# Patient Record
Sex: Female | Born: 1935 | Race: White | Hispanic: No | Marital: Married
Health system: Southern US, Community
[De-identification: ages and names within clinical notes are randomized; demographics above are authoritative.]

---

## 2004-09-03 ENCOUNTER — Ambulatory Visit: Payer: Self-pay | Admitting: Family Medicine

## 2004-09-08 ENCOUNTER — Ambulatory Visit: Payer: Self-pay | Admitting: Unknown Physician Specialty

## 2007-03-16 ENCOUNTER — Ambulatory Visit: Payer: Self-pay | Admitting: Emergency Medicine

## 2007-04-18 ENCOUNTER — Ambulatory Visit: Payer: Self-pay | Admitting: Ophthalmology

## 2007-04-18 ENCOUNTER — Other Ambulatory Visit: Payer: Self-pay

## 2007-04-26 ENCOUNTER — Ambulatory Visit: Payer: Self-pay | Admitting: Ophthalmology

## 2007-05-24 ENCOUNTER — Ambulatory Visit: Payer: Self-pay | Admitting: Ophthalmology

## 2007-10-27 ENCOUNTER — Ambulatory Visit: Payer: Self-pay | Admitting: Family Medicine

## 2008-10-02 ENCOUNTER — Ambulatory Visit: Payer: Self-pay | Admitting: Family Medicine

## 2009-02-12 ENCOUNTER — Ambulatory Visit: Payer: Self-pay | Admitting: Unknown Physician Specialty

## 2009-05-14 ENCOUNTER — Ambulatory Visit: Payer: Self-pay | Admitting: Family Medicine

## 2009-09-09 ENCOUNTER — Ambulatory Visit: Payer: Self-pay | Admitting: Family Medicine

## 2010-06-28 ENCOUNTER — Inpatient Hospital Stay: Payer: Self-pay | Admitting: Internal Medicine

## 2010-07-14 ENCOUNTER — Ambulatory Visit: Payer: Self-pay | Admitting: Family Medicine

## 2011-12-16 ENCOUNTER — Ambulatory Visit: Payer: Self-pay | Admitting: Family Medicine

## 2012-03-05 ENCOUNTER — Emergency Department: Payer: Self-pay | Admitting: Emergency Medicine

## 2012-03-05 LAB — CBC
HCT: 33.1 % — ABNORMAL LOW (ref 35.0–47.0)
HGB: 11 g/dL — ABNORMAL LOW (ref 12.0–16.0)
MCV: 89 fL (ref 80–100)
Platelet: 261 10*3/uL (ref 150–440)
RBC: 3.72 10*6/uL — ABNORMAL LOW (ref 3.80–5.20)
WBC: 8.3 10*3/uL (ref 3.6–11.0)

## 2012-03-05 LAB — COMPREHENSIVE METABOLIC PANEL
BUN: 17 mg/dL (ref 7–18)
Bilirubin,Total: 0.3 mg/dL (ref 0.2–1.0)
Calcium, Total: 8.6 mg/dL (ref 8.5–10.1)
Chloride: 98 mmol/L (ref 98–107)
EGFR (Non-African Amer.): >60
Potassium: 3.8 mmol/L (ref 3.5–5.1)
SGOT(AST): 26 U/L (ref 15–37)

## 2012-03-05 LAB — TROPONIN I: Troponin-I: 0.02 ng/mL

## 2012-05-17 ENCOUNTER — Inpatient Hospital Stay: Payer: Self-pay | Admitting: Internal Medicine

## 2012-05-17 LAB — COMPREHENSIVE METABOLIC PANEL
Albumin: 3.2 g/dL — ABNORMAL LOW (ref 3.4–5.0)
Alkaline Phosphatase: 64 U/L (ref 50–136)
Anion Gap: 8 (ref 7–16)
BUN: 16 mg/dL (ref 7–18)
Calcium, Total: 8.6 mg/dL (ref 8.5–10.1)
Chloride: 94 mmol/L — ABNORMAL LOW (ref 98–107)
Co2: 32 mmol/L (ref 21–32)
Creatinine: 0.55 mg/dL — ABNORMAL LOW (ref 0.60–1.30)
EGFR (Non-African Amer.): >60
Osmolality: 271 (ref 275–301)
Potassium: 3.9 mmol/L (ref 3.5–5.1)
SGOT(AST): 47 U/L — ABNORMAL HIGH (ref 15–37)
SGPT (ALT): 84 U/L — ABNORMAL HIGH (ref 12–78)
Sodium: 134 mmol/L — ABNORMAL LOW (ref 136–145)

## 2012-05-17 LAB — IRON AND TIBC
Iron Saturation: 15 %
Iron: 44 ug/dL — ABNORMAL LOW (ref 50–170)
Unbound Iron-Bind.Cap.: 259 ug/dL

## 2012-05-17 LAB — CBC
HCT: 26.9 % — ABNORMAL LOW (ref 35.0–47.0)
HGB: 8.5 g/dL — ABNORMAL LOW (ref 12.0–16.0)
MCH: 29.6 pg (ref 26.0–34.0)
MCHC: 31.8 g/dL — ABNORMAL LOW (ref 32.0–36.0)
MCV: 93 fL (ref 80–100)
Platelet: 381 10*3/uL (ref 150–440)
RBC: 2.89 10*6/uL — ABNORMAL LOW (ref 3.80–5.20)
WBC: 13.6 10*3/uL — ABNORMAL HIGH (ref 3.6–11.0)

## 2012-05-17 LAB — FERRITIN: Ferritin (ARMC): 314 ng/mL (ref 8–388)

## 2012-05-17 LAB — CK TOTAL AND CKMB (NOT AT ARMC)
CK, Total: 66 U/L (ref 21–215)
CK, Total: 69 U/L (ref 21–215)
CK-MB: 1.8 ng/mL (ref 0.5–3.6)
CK-MB: 2.4 ng/mL (ref 0.5–3.6)

## 2012-05-17 LAB — PRO B NATRIURETIC PEPTIDE: B-Type Natriuretic Peptide: 2917 pg/mL — ABNORMAL HIGH (ref 0–450)

## 2012-05-17 LAB — TROPONIN I: Troponin-I: 0.27 ng/mL — ABNORMAL HIGH

## 2012-05-17 LAB — PROTIME-INR: INR: 1.1

## 2012-05-18 LAB — CBC WITH DIFFERENTIAL/PLATELET
Basophil #: 0 10*3/uL (ref 0.0–0.1)
Basophil %: 0.2 %
Eosinophil #: 0 10*3/uL (ref 0.0–0.7)
Eosinophil %: 0 %
HCT: 26.4 % — ABNORMAL LOW (ref 35.0–47.0)
HGB: 9.1 g/dL — ABNORMAL LOW (ref 12.0–16.0)
Lymphocyte %: 4.7 %
MCH: 31.4 pg (ref 26.0–34.0)
MCHC: 34.4 g/dL (ref 32.0–36.0)
MCV: 91 fL (ref 80–100)
Monocyte #: 0.3 x10 3/mm (ref 0.2–0.9)
Neutrophil #: 6 10*3/uL (ref 1.4–6.5)
Neutrophil %: 90.4 %
RBC: 2.89 10*6/uL — ABNORMAL LOW (ref 3.80–5.20)
WBC: 6.6 10*3/uL (ref 3.6–11.0)

## 2012-05-18 LAB — LIPID PANEL
HDL Cholesterol: 36 mg/dL — ABNORMAL LOW (ref 40–60)
Ldl Cholesterol, Calc: 109 mg/dL — ABNORMAL HIGH (ref 0–100)
VLDL Cholesterol, Calc: 18 mg/dL (ref 5–40)

## 2012-05-18 LAB — COMPREHENSIVE METABOLIC PANEL
Albumin: 2.7 g/dL — ABNORMAL LOW (ref 3.4–5.0)
Alkaline Phosphatase: 52 U/L (ref 50–136)
Calcium, Total: 8.2 mg/dL — ABNORMAL LOW (ref 8.5–10.1)
Creatinine: 0.64 mg/dL (ref 0.60–1.30)
Glucose: 110 mg/dL — ABNORMAL HIGH (ref 65–99)
Osmolality: 275 (ref 275–301)
SGOT(AST): 27 U/L (ref 15–37)
Sodium: 137 mmol/L (ref 136–145)

## 2012-05-18 LAB — TROPONIN I: Troponin-I: 0.83 ng/mL — ABNORMAL HIGH

## 2012-05-18 LAB — HEMOGLOBIN A1C: Hemoglobin A1C: 5.8 % (ref 4.2–6.3)

## 2012-05-18 LAB — CK TOTAL AND CKMB (NOT AT ARMC): CK, Total: 60 U/L (ref 21–215)

## 2012-05-18 LAB — PROTEIN, BODY FLUID: Protein, Body Fluid: 1.3 g/dL

## 2012-05-19 LAB — CBC WITH DIFFERENTIAL/PLATELET
Basophil #: 0 10*3/uL (ref 0.0–0.1)
Eosinophil #: 0 10*3/uL (ref 0.0–0.7)
Eosinophil %: 0.1 %
HCT: 27.5 % — ABNORMAL LOW (ref 35.0–47.0)
HGB: 9.1 g/dL — ABNORMAL LOW (ref 12.0–16.0)
Lymphocyte #: 0.8 10*3/uL — ABNORMAL LOW (ref 1.0–3.6)
Lymphocyte %: 7.7 %
MCH: 30.5 pg (ref 26.0–34.0)
MCHC: 33.2 g/dL (ref 32.0–36.0)
MCV: 92 fL (ref 80–100)
Monocyte #: 1.2 x10 3/mm — ABNORMAL HIGH (ref 0.2–0.9)
Neutrophil #: 9 10*3/uL — ABNORMAL HIGH (ref 1.4–6.5)
RDW: 15.1 % — ABNORMAL HIGH (ref 11.5–14.5)
WBC: 11.1 10*3/uL — ABNORMAL HIGH (ref 3.6–11.0)

## 2012-05-19 LAB — BASIC METABOLIC PANEL
Anion Gap: 3 — ABNORMAL LOW (ref 7–16)
BUN: 17 mg/dL (ref 7–18)
Co2: 41 mmol/L (ref 21–32)
Creatinine: 0.89 mg/dL (ref 0.60–1.30)
EGFR (African American): >60
EGFR (Non-African Amer.): >60
Glucose: 99 mg/dL (ref 65–99)
Sodium: 133 mmol/L — ABNORMAL LOW (ref 136–145)

## 2012-05-19 LAB — TROPONIN I: Troponin-I: 0.42 ng/mL — ABNORMAL HIGH

## 2012-05-20 LAB — BASIC METABOLIC PANEL
Anion Gap: 8 (ref 7–16)
BUN: 13 mg/dL (ref 7–18)
Chloride: 88 mmol/L — ABNORMAL LOW (ref 98–107)
Co2: 34 mmol/L — ABNORMAL HIGH (ref 21–32)
Creatinine: 0.77 mg/dL (ref 0.60–1.30)
Creatinine: 0.91 mg/dL (ref 0.60–1.30)
EGFR (African American): >60
EGFR (African American): >60
EGFR (Non-African Amer.): >60
EGFR (Non-African Amer.): >60
Glucose: 87 mg/dL (ref 65–99)
Osmolality: 262 (ref 275–301)
Potassium: 3.3 mmol/L — ABNORMAL LOW (ref 3.5–5.1)
Sodium: 128 mmol/L — ABNORMAL LOW (ref 136–145)

## 2012-05-20 LAB — CBC WITH DIFFERENTIAL/PLATELET
Basophil %: 0.1 %
Eosinophil #: 0.1 10*3/uL (ref 0.0–0.7)
Eosinophil %: 0.7 %
HCT: 27.4 % — ABNORMAL LOW (ref 35.0–47.0)
HGB: 9.1 g/dL — ABNORMAL LOW (ref 12.0–16.0)
Lymphocyte #: 0.7 10*3/uL — ABNORMAL LOW (ref 1.0–3.6)
MCH: 30.3 pg (ref 26.0–34.0)
MCHC: 33.2 g/dL (ref 32.0–36.0)
MCV: 91 fL (ref 80–100)
Monocyte #: 0.7 x10 3/mm (ref 0.2–0.9)
Neutrophil #: 6.3 10*3/uL (ref 1.4–6.5)
RBC: 3 10*6/uL — ABNORMAL LOW (ref 3.80–5.20)

## 2012-05-21 LAB — BASIC METABOLIC PANEL
Anion Gap: 6 — ABNORMAL LOW (ref 7–16)
Calcium, Total: 8.6 mg/dL (ref 8.5–10.1)
Chloride: 95 mmol/L — ABNORMAL LOW (ref 98–107)
EGFR (African American): >60
EGFR (Non-African Amer.): >60
Glucose: 92 mg/dL (ref 65–99)
Osmolality: 267 (ref 275–301)
Potassium: 3.9 mmol/L (ref 3.5–5.1)
Sodium: 134 mmol/L — ABNORMAL LOW (ref 136–145)

## 2012-05-22 ENCOUNTER — Telehealth: Payer: Self-pay

## 2012-05-22 LAB — BASIC METABOLIC PANEL
BUN: 7 mg/dL (ref 7–18)
Calcium, Total: 9 mg/dL (ref 8.5–10.1)
Chloride: 98 mmol/L (ref 98–107)
Co2: 33 mmol/L — ABNORMAL HIGH (ref 21–32)
Creatinine: 0.83 mg/dL (ref 0.60–1.30)
EGFR (African American): >60
EGFR (Non-African Amer.): >60
Osmolality: 270 (ref 275–301)
Potassium: 4.4 mmol/L (ref 3.5–5.1)

## 2012-05-22 LAB — BODY FLUID CULTURE

## 2012-05-22 NOTE — Telephone Encounter (Signed)
Yes, please have Home Health Care contact [facility] at [phone] for orders. Please advise Home Health Care that I no longer work at [facility] and patient has not established care with me in [place]; thus, I cannot provide the orders.

## 2012-05-22 NOTE — Telephone Encounter (Signed)
JULIE FROM [facility] STATES THEY ARE RELEASING PT TODAY AND WOULD LIKE TO KNOW IF DR Katrinka Blazing WOULD BE WILLING TO DO HOME HEALTH CARE ON PT PLEASE CALL [phone] SHE HASN'T BEEN A PT HERE SO I DO NOT HAVE A DATE TO LOOK AT

## 2012-05-22 NOTE — Telephone Encounter (Signed)
Dr Katrinka Blazing, Do we need to advise Home Health Care to contact your former office? Will one of the providers over there write HH orders for pt?

## 2012-05-23 NOTE — Telephone Encounter (Signed)
LMOM w/info to call [facility] for orders and explained situation. Gave her number to call.

## 2012-05-29 ENCOUNTER — Emergency Department: Payer: Self-pay | Admitting: Emergency Medicine

## 2012-05-29 LAB — CBC
HCT: 30.5 % — ABNORMAL LOW (ref 35.0–47.0)
MCV: 89 fL (ref 80–100)
Platelet: 382 10*3/uL (ref 150–440)
RBC: 3.42 10*6/uL — ABNORMAL LOW (ref 3.80–5.20)
RDW: 15.7 % — ABNORMAL HIGH (ref 11.5–14.5)
WBC: 4.9 10*3/uL (ref 3.6–11.0)

## 2012-05-29 LAB — URINALYSIS, COMPLETE
Bacteria: NONE SEEN
Bilirubin,UR: NEGATIVE
Blood: NEGATIVE
Glucose,UR: NEGATIVE mg/dL (ref 0–75)
Ketone: NEGATIVE
Leukocyte Esterase: NEGATIVE
Nitrite: NEGATIVE
Ph: 6 (ref 4.5–8.0)
Protein: NEGATIVE
RBC,UR: 17 /HPF (ref 0–5)
Specific Gravity: 1.006 (ref 1.003–1.030)
Squamous Epithelial: NONE SEEN
WBC UR: 10 /HPF (ref 0–5)

## 2012-05-29 LAB — COMPREHENSIVE METABOLIC PANEL
Albumin: 3.6 g/dL (ref 3.4–5.0)
Anion Gap: 8 (ref 7–16)
Chloride: 99 mmol/L (ref 98–107)
Co2: 27 mmol/L (ref 21–32)
EGFR (African American): >60
Osmolality: 270 (ref 275–301)
Potassium: 4.4 mmol/L (ref 3.5–5.1)
SGOT(AST): 19 U/L (ref 15–37)
SGPT (ALT): 37 U/L (ref 12–78)
Total Protein: 6.9 g/dL (ref 6.4–8.2)

## 2012-05-29 LAB — TROPONIN I: Troponin-I: 0.03 ng/mL

## 2012-06-12 ENCOUNTER — Inpatient Hospital Stay: Payer: Self-pay | Admitting: Internal Medicine

## 2012-06-12 LAB — COMPREHENSIVE METABOLIC PANEL
Albumin: 3.4 g/dL (ref 3.4–5.0)
Alkaline Phosphatase: 86 U/L (ref 50–136)
Anion Gap: 9 (ref 7–16)
BUN: 11 mg/dL (ref 7–18)
Bilirubin,Total: 0.1 mg/dL — ABNORMAL LOW (ref 0.2–1.0)
Co2: 19 mmol/L — ABNORMAL LOW (ref 21–32)
EGFR (African American): >60
Glucose: 87 mg/dL (ref 65–99)
Osmolality: 256 (ref 275–301)
Potassium: 5.3 mmol/L — ABNORMAL HIGH (ref 3.5–5.1)
Sodium: 128 mmol/L — ABNORMAL LOW (ref 136–145)

## 2012-06-12 LAB — URINALYSIS, COMPLETE
Bilirubin,UR: NEGATIVE
Blood: NEGATIVE
Hyaline Cast: 1
Ketone: NEGATIVE
Nitrite: NEGATIVE
Ph: 5 (ref 4.5–8.0)
Protein: NEGATIVE
RBC,UR: <1 /HPF (ref 0–5)
Specific Gravity: 1.005 (ref 1.003–1.030)
Squamous Epithelial: <1

## 2012-06-12 LAB — CBC
HGB: 10.2 g/dL — ABNORMAL LOW (ref 12.0–16.0)
MCH: 30.5 pg (ref 26.0–34.0)
MCHC: 34 g/dL (ref 32.0–36.0)
Platelet: 374 10*3/uL (ref 150–440)
RDW: 16.4 % — ABNORMAL HIGH (ref 11.5–14.5)
WBC: 8.7 10*3/uL (ref 3.6–11.0)

## 2012-06-12 LAB — TROPONIN I: Troponin-I: 0.03 ng/mL

## 2012-06-12 LAB — ACETAMINOPHEN LEVEL: Acetaminophen: 30 ug/mL

## 2012-06-12 LAB — URINE PH: Ph: 7 (ref 4.5–8.0)

## 2012-06-12 LAB — CK TOTAL AND CKMB (NOT AT ARMC): CK-MB: <0.5 ng/mL — ABNORMAL LOW (ref 0.5–3.6)

## 2012-06-12 LAB — SALICYLATE LEVEL: Salicylates, Serum: 44.5 mg/dL

## 2012-06-13 LAB — BASIC METABOLIC PANEL
Calcium, Total: 7.5 mg/dL — ABNORMAL LOW (ref 8.5–10.1)
Co2: 32 mmol/L (ref 21–32)
EGFR (African American): >60
EGFR (Non-African Amer.): 53 — ABNORMAL LOW
Glucose: 96 mg/dL (ref 65–99)
Osmolality: 273 (ref 275–301)

## 2012-06-13 LAB — HEPATIC FUNCTION PANEL A (ARMC)
Albumin: 2.8 g/dL — ABNORMAL LOW (ref 3.4–5.0)
Alkaline Phosphatase: 72 U/L (ref 50–136)
Bilirubin, Direct: <0.1 mg/dL (ref 0.00–0.20)
Total Protein: 5.5 g/dL — ABNORMAL LOW (ref 6.4–8.2)

## 2012-06-13 LAB — CBC WITH DIFFERENTIAL/PLATELET
Basophil %: 1.2 %
Eosinophil %: 0.8 %
Lymphocyte #: 2 10*3/uL (ref 1.0–3.6)
Lymphocyte %: 33.7 %
MCH: 30.6 pg (ref 26.0–34.0)
MCHC: 34.4 g/dL (ref 32.0–36.0)
MCV: 89 fL (ref 80–100)
Monocyte %: 8.2 %
Platelet: 326 10*3/uL (ref 150–440)
RDW: 16.2 % — ABNORMAL HIGH (ref 11.5–14.5)
WBC: 6.1 10*3/uL (ref 3.6–11.0)

## 2012-06-13 LAB — SALICYLATE LEVEL: Salicylates, Serum: 31.7 mg/dL

## 2012-06-13 LAB — ACETAMINOPHEN LEVEL: Acetaminophen: 8 ug/mL — ABNORMAL LOW

## 2012-07-27 ENCOUNTER — Inpatient Hospital Stay: Payer: Self-pay | Admitting: Internal Medicine

## 2012-07-27 LAB — COMPREHENSIVE METABOLIC PANEL
Albumin: 3.1 g/dL — ABNORMAL LOW (ref 3.4–5.0)
Anion Gap: 11 (ref 7–16)
BUN: 12 mg/dL (ref 7–18)
Bilirubin,Total: 0.2 mg/dL (ref 0.2–1.0)
Creatinine: 0.8 mg/dL (ref 0.60–1.30)
EGFR (African American): >60
EGFR (Non-African Amer.): >60
Glucose: 89 mg/dL (ref 65–99)
Osmolality: 240 (ref 275–301)
SGOT(AST): 20 U/L (ref 15–37)
Sodium: 119 mmol/L — CL (ref 136–145)

## 2012-07-27 LAB — DRUG SCREEN, URINE
Amphetamines, Ur Screen: NEGATIVE (ref ?–1000)
Benzodiazepine, Ur Scrn: NEGATIVE (ref ?–200)
Cannabinoid 50 Ng, Ur ~~LOC~~: NEGATIVE (ref ?–50)
MDMA (Ecstasy)Ur Screen: NEGATIVE (ref ?–500)
Methadone, Ur Screen: NEGATIVE (ref ?–300)
Phencyclidine (PCP) Ur S: NEGATIVE (ref ?–25)

## 2012-07-27 LAB — CBC
HCT: 31.1 % — ABNORMAL LOW (ref 35.0–47.0)
HGB: 10.9 g/dL — ABNORMAL LOW (ref 12.0–16.0)
MCHC: 35 g/dL (ref 32.0–36.0)
RBC: 3.63 10*6/uL — ABNORMAL LOW (ref 3.80–5.20)
RDW: 15.4 % — ABNORMAL HIGH (ref 11.5–14.5)
WBC: 26.2 10*3/uL — ABNORMAL HIGH (ref 3.6–11.0)

## 2012-07-27 LAB — SALICYLATE LEVEL: Salicylates, Serum: 20.4 mg/dL — ABNORMAL HIGH

## 2012-07-27 LAB — URINALYSIS, COMPLETE
Bacteria: NONE SEEN
Bilirubin,UR: NEGATIVE
Blood: NEGATIVE
Glucose,UR: NEGATIVE mg/dL (ref 0–75)
Nitrite: NEGATIVE
Protein: NEGATIVE
RBC,UR: NONE SEEN /HPF (ref 0–5)
Squamous Epithelial: NONE SEEN
WBC UR: NONE SEEN /HPF (ref 0–5)

## 2012-07-27 LAB — TROPONIN I: Troponin-I: <0.02 ng/mL

## 2012-07-27 LAB — ACETAMINOPHEN LEVEL: Acetaminophen: 6 ug/mL — ABNORMAL LOW

## 2012-07-27 LAB — AMMONIA: Ammonia, Plasma: <25 mcmol/L (ref 11–32)

## 2012-07-28 LAB — BASIC METABOLIC PANEL
BUN: 11 mg/dL (ref 7–18)
Chloride: 91 mmol/L — ABNORMAL LOW (ref 98–107)
Osmolality: 246 (ref 275–301)
Potassium: 4.2 mmol/L (ref 3.5–5.1)

## 2012-07-28 LAB — CBC WITH DIFFERENTIAL/PLATELET
Basophil #: 0 10*3/uL (ref 0.0–0.1)
Basophil %: 0.3 %
Eosinophil #: 0.1 10*3/uL (ref 0.0–0.7)
Eosinophil %: 0.4 %
HGB: 9.6 g/dL — ABNORMAL LOW (ref 12.0–16.0)
Lymphocyte %: 6.8 %
MCV: 87 fL (ref 80–100)
Monocyte %: 5.3 %
Neutrophil %: 87.2 %
Platelet: 285 10*3/uL (ref 150–440)
RBC: 3.28 10*6/uL — ABNORMAL LOW (ref 3.80–5.20)
RDW: 15.2 % — ABNORMAL HIGH (ref 11.5–14.5)
WBC: 16.2 10*3/uL — ABNORMAL HIGH (ref 3.6–11.0)

## 2012-07-28 LAB — URIC ACID: Uric Acid: 3.7 mg/dL (ref 2.6–6.0)

## 2012-07-28 LAB — SODIUM: Sodium: 123 mmol/L — ABNORMAL LOW (ref 136–145)

## 2012-07-29 LAB — CBC WITH DIFFERENTIAL/PLATELET
Basophil #: 0.1 10*3/uL (ref 0.0–0.1)
Eosinophil %: 1.3 %
HCT: 27.1 % — ABNORMAL LOW (ref 35.0–47.0)
HGB: 9.6 g/dL — ABNORMAL LOW (ref 12.0–16.0)
Lymphocyte #: 1.2 10*3/uL (ref 1.0–3.6)
MCH: 30.9 pg (ref 26.0–34.0)
MCHC: 35.6 g/dL (ref 32.0–36.0)
Monocyte #: 0.6 x10 3/mm (ref 0.2–0.9)
Monocyte %: 7.7 %
Neutrophil #: 5.6 10*3/uL (ref 1.4–6.5)
Neutrophil %: 74.8 %
RBC: 3.13 10*6/uL — ABNORMAL LOW (ref 3.80–5.20)

## 2012-07-29 LAB — URINE CULTURE

## 2012-07-30 LAB — SODIUM
Sodium: 135 mmol/L — ABNORMAL LOW (ref 136–145)
Sodium: 135 mmol/L — ABNORMAL LOW (ref 136–145)

## 2012-07-31 LAB — PROTEIN ELECTROPHORESIS(ARMC)

## 2012-08-02 LAB — CULTURE, BLOOD (SINGLE)

## 2012-10-14 ENCOUNTER — Emergency Department: Payer: Self-pay | Admitting: Emergency Medicine

## 2012-10-14 LAB — BASIC METABOLIC PANEL
Anion Gap: 7 (ref 7–16)
Chloride: 100 mmol/L (ref 98–107)
Co2: 26 mmol/L (ref 21–32)
Creatinine: 0.84 mg/dL (ref 0.60–1.30)
EGFR (African American): >60
Osmolality: 266 (ref 275–301)
Sodium: 133 mmol/L — ABNORMAL LOW (ref 136–145)

## 2012-10-14 LAB — CBC
HCT: 31.3 % — ABNORMAL LOW (ref 35.0–47.0)
HGB: 10.3 g/dL — ABNORMAL LOW (ref 12.0–16.0)
MCH: 27.9 pg (ref 26.0–34.0)
MCHC: 33.1 g/dL (ref 32.0–36.0)
MCV: 84 fL (ref 80–100)
Platelet: 382 10*3/uL (ref 150–440)
RBC: 3.71 10*6/uL — ABNORMAL LOW (ref 3.80–5.20)
RDW: 15.3 % — ABNORMAL HIGH (ref 11.5–14.5)

## 2012-10-14 LAB — TROPONIN I: Troponin-I: <0.02 ng/mL

## 2012-12-24 ENCOUNTER — Inpatient Hospital Stay: Payer: Self-pay | Admitting: Internal Medicine

## 2012-12-24 LAB — COMPREHENSIVE METABOLIC PANEL
BUN: 20 mg/dL — ABNORMAL HIGH (ref 7–18)
Bilirubin,Total: 0.1 mg/dL — ABNORMAL LOW (ref 0.2–1.0)
Calcium, Total: 8.2 mg/dL — ABNORMAL LOW (ref 8.5–10.1)
Co2: 26 mmol/L (ref 21–32)
Creatinine: 1.25 mg/dL (ref 0.60–1.30)
EGFR (African American): 49 — ABNORMAL LOW
Glucose: 187 mg/dL — ABNORMAL HIGH (ref 65–99)
Osmolality: 274 (ref 275–301)
SGOT(AST): 43 U/L — ABNORMAL HIGH (ref 15–37)
SGPT (ALT): 81 U/L — ABNORMAL HIGH (ref 12–78)

## 2012-12-24 LAB — CBC
HGB: 11 g/dL — ABNORMAL LOW (ref 12.0–16.0)
Platelet: 359 10*3/uL (ref 150–440)
RBC: 3.83 10*6/uL (ref 3.80–5.20)
RDW: 15.8 % — ABNORMAL HIGH (ref 11.5–14.5)
WBC: 13.4 10*3/uL — ABNORMAL HIGH (ref 3.6–11.0)

## 2012-12-24 LAB — CK TOTAL AND CKMB (NOT AT ARMC): CK, Total: 40 U/L (ref 21–215)

## 2012-12-25 LAB — CBC WITH DIFFERENTIAL/PLATELET
Basophil #: 0 10*3/uL (ref 0.0–0.1)
Basophil %: 0.3 %
Eosinophil #: 0 10*3/uL (ref 0.0–0.7)
Eosinophil %: 0 %
HCT: 30.3 % — ABNORMAL LOW (ref 35.0–47.0)
HGB: 10.3 g/dL — ABNORMAL LOW (ref 12.0–16.0)
Lymphocyte #: 0.6 10*3/uL — ABNORMAL LOW (ref 1.0–3.6)
Lymphocyte %: 5.3 %
MCH: 28.8 pg (ref 26.0–34.0)
MCV: 85 fL (ref 80–100)
Monocyte #: 0.2 x10 3/mm (ref 0.2–0.9)
Neutrophil #: 10.8 10*3/uL — ABNORMAL HIGH (ref 1.4–6.5)
Platelet: 327 10*3/uL (ref 150–440)
RBC: 3.56 10*6/uL — ABNORMAL LOW (ref 3.80–5.20)
RDW: 16.1 % — ABNORMAL HIGH (ref 11.5–14.5)
WBC: 11.7 10*3/uL — ABNORMAL HIGH (ref 3.6–11.0)

## 2012-12-25 LAB — BASIC METABOLIC PANEL
Anion Gap: 6 — ABNORMAL LOW (ref 7–16)
BUN: 17 mg/dL (ref 7–18)
Co2: 28 mmol/L (ref 21–32)
EGFR (African American): >60
EGFR (Non-African Amer.): >60
Potassium: 4.4 mmol/L (ref 3.5–5.1)
Sodium: 129 mmol/L — ABNORMAL LOW (ref 136–145)

## 2012-12-26 LAB — BASIC METABOLIC PANEL
Calcium, Total: 8.4 mg/dL — ABNORMAL LOW (ref 8.5–10.1)
EGFR (Non-African Amer.): >60
Glucose: 148 mg/dL — ABNORMAL HIGH (ref 65–99)
Potassium: 4.3 mmol/L (ref 3.5–5.1)
Sodium: 127 mmol/L — ABNORMAL LOW (ref 136–145)

## 2012-12-26 LAB — CBC WITH DIFFERENTIAL/PLATELET
Basophil #: 0 10*3/uL (ref 0.0–0.1)
Eosinophil #: 0 10*3/uL (ref 0.0–0.7)
Eosinophil %: 0 %
Lymphocyte %: 5.6 %
MCH: 28.6 pg (ref 26.0–34.0)
MCHC: 33.8 g/dL (ref 32.0–36.0)
MCV: 85 fL (ref 80–100)
Monocyte #: 0.3 x10 3/mm (ref 0.2–0.9)
Monocyte %: 3.2 %
Neutrophil %: 91.1 %
Platelet: 341 10*3/uL (ref 150–440)
RBC: 3.5 10*6/uL — ABNORMAL LOW (ref 3.80–5.20)

## 2012-12-30 LAB — CULTURE, BLOOD (SINGLE)

## 2013-01-08 ENCOUNTER — Inpatient Hospital Stay: Payer: Self-pay | Admitting: Internal Medicine

## 2013-01-08 LAB — URINALYSIS, COMPLETE
Bacteria: NONE SEEN
Bilirubin,UR: NEGATIVE
Blood: NEGATIVE
Ketone: NEGATIVE
Leukocyte Esterase: NEGATIVE
Ph: 9 (ref 4.5–8.0)
Protein: NEGATIVE
Squamous Epithelial: 3
WBC UR: <1 /HPF (ref 0–5)

## 2013-01-08 LAB — COMPREHENSIVE METABOLIC PANEL
Anion Gap: 6 — ABNORMAL LOW (ref 7–16)
Chloride: 89 mmol/L — ABNORMAL LOW (ref 98–107)
Glucose: 150 mg/dL — ABNORMAL HIGH (ref 65–99)
Potassium: 5.3 mmol/L — ABNORMAL HIGH (ref 3.5–5.1)
SGPT (ALT): 89 U/L — ABNORMAL HIGH (ref 12–78)
Total Protein: 6.4 g/dL (ref 6.4–8.2)

## 2013-01-08 LAB — CBC
HCT: 30.6 % — ABNORMAL LOW (ref 35.0–47.0)
MCHC: 32.9 g/dL (ref 32.0–36.0)
Platelet: 209 10*3/uL (ref 150–440)
WBC: 8.6 10*3/uL (ref 3.6–11.0)

## 2013-01-08 LAB — PRO B NATRIURETIC PEPTIDE: B-Type Natriuretic Peptide: 139 pg/mL (ref 0–450)

## 2013-01-09 LAB — BASIC METABOLIC PANEL
Anion Gap: 8 (ref 7–16)
Co2: 30 mmol/L (ref 21–32)
Creatinine: 1 mg/dL (ref 0.60–1.30)
EGFR (African American): >60
Glucose: 149 mg/dL — ABNORMAL HIGH (ref 65–99)
Osmolality: 258 (ref 275–301)

## 2013-01-09 LAB — CBC WITH DIFFERENTIAL/PLATELET
Basophil #: 0 10*3/uL (ref 0.0–0.1)
Basophil %: 0.1 %
Eosinophil #: 0 10*3/uL (ref 0.0–0.7)
Eosinophil %: 0 %
HCT: 29.3 % — ABNORMAL LOW (ref 35.0–47.0)
HGB: 9.8 g/dL — ABNORMAL LOW (ref 12.0–16.0)
Lymphocyte #: 0.4 10*3/uL — ABNORMAL LOW (ref 1.0–3.6)
Lymphocyte %: 4.5 %
MCH: 28 pg (ref 26.0–34.0)
MCHC: 33.4 g/dL (ref 32.0–36.0)
MCV: 84 fL (ref 80–100)
Monocyte #: 0 x10 3/mm — ABNORMAL LOW (ref 0.2–0.9)
Monocyte %: 0.6 %
Neutrophil %: 94.8 %
Platelet: 206 10*3/uL (ref 150–440)
RBC: 3.49 10*6/uL — ABNORMAL LOW (ref 3.80–5.20)
RDW: 15.6 % — ABNORMAL HIGH (ref 11.5–14.5)
WBC: 8.8 10*3/uL (ref 3.6–11.0)

## 2013-01-11 LAB — BASIC METABOLIC PANEL
Anion Gap: 6 — ABNORMAL LOW (ref 7–16)
BUN: 23 mg/dL — ABNORMAL HIGH (ref 7–18)
BUN: 21 mg/dL — ABNORMAL HIGH (ref 7–18)
Calcium, Total: 8.4 mg/dL — ABNORMAL LOW (ref 8.5–10.1)
Calcium, Total: 8.4 mg/dL — ABNORMAL LOW (ref 8.5–10.1)
Calcium, Total: 8.1 mg/dL — ABNORMAL LOW (ref 8.5–10.1)
Chloride: 82 mmol/L — ABNORMAL LOW (ref 98–107)
Chloride: 84 mmol/L — ABNORMAL LOW (ref 98–107)
Co2: 29 mmol/L (ref 21–32)
Co2: 30 mmol/L (ref 21–32)
Creatinine: 0.88 mg/dL (ref 0.60–1.30)
EGFR (Non-African Amer.): 52 — ABNORMAL LOW
Glucose: 142 mg/dL — ABNORMAL HIGH (ref 65–99)
Glucose: 208 mg/dL — ABNORMAL HIGH (ref 65–99)
Glucose: 167 mg/dL — ABNORMAL HIGH (ref 65–99)
Osmolality: 246 (ref 275–301)
Osmolality: 249 (ref 275–301)
Potassium: 4.8 mmol/L (ref 3.5–5.1)
Potassium: 4.5 mmol/L (ref 3.5–5.1)
Sodium: 122 mmol/L — ABNORMAL LOW (ref 136–145)

## 2013-01-11 LAB — SODIUM, URINE, RANDOM: Sodium, Urine Random: 8 mmol/L (ref 20–110)

## 2013-01-11 LAB — OSMOLALITY, URINE: Osmolality: 502 mOsm/kg

## 2013-01-11 LAB — CREATININE, URINE, RANDOM: Creatinine, Urine Random: 93.1 mg/dL (ref 30.0–125.0)

## 2013-01-11 LAB — URIC ACID: Uric Acid: 3.5 mg/dL (ref 2.6–6.0)

## 2013-01-11 LAB — OSMOLALITY: Osmolality: 268 mOsm/kg — ABNORMAL LOW (ref 280–301)

## 2013-01-12 LAB — BASIC METABOLIC PANEL
Anion Gap: 4 — ABNORMAL LOW (ref 7–16)
Calcium, Total: 8.7 mg/dL (ref 8.5–10.1)
Chloride: 88 mmol/L — ABNORMAL LOW (ref 98–107)
Co2: 33 mmol/L — ABNORMAL HIGH (ref 21–32)
Creatinine: 0.87 mg/dL (ref 0.60–1.30)
EGFR (Non-African Amer.): >60
Potassium: 4.6 mmol/L (ref 3.5–5.1)

## 2013-01-13 LAB — BASIC METABOLIC PANEL
BUN: 17 mg/dL (ref 7–18)
Chloride: 90 mmol/L — ABNORMAL LOW (ref 98–107)
Co2: 32 mmol/L (ref 21–32)
EGFR (African American): >60
EGFR (Non-African Amer.): >60
Osmolality: 261 (ref 275–301)
Potassium: 4.1 mmol/L (ref 3.5–5.1)
Sodium: 128 mmol/L — ABNORMAL LOW (ref 136–145)

## 2013-07-05 ENCOUNTER — Inpatient Hospital Stay: Payer: Self-pay | Admitting: Internal Medicine

## 2013-07-05 LAB — BASIC METABOLIC PANEL
Anion Gap: 6 — ABNORMAL LOW (ref 7–16)
BUN: 37 mg/dL — ABNORMAL HIGH (ref 7–18)
Creatinine: 2.1 mg/dL — ABNORMAL HIGH (ref 0.60–1.30)
Glucose: 219 mg/dL — ABNORMAL HIGH (ref 65–99)
Potassium: 3.5 mmol/L (ref 3.5–5.1)
Sodium: 140 mmol/L (ref 136–145)

## 2013-07-05 LAB — HEPATIC FUNCTION PANEL A (ARMC)
Albumin: 3.2 g/dL — ABNORMAL LOW (ref 3.4–5.0)
Bilirubin, Direct: <0.1 mg/dL (ref 0.00–0.20)
SGOT(AST): 38 U/L — ABNORMAL HIGH (ref 15–37)
SGPT (ALT): 49 U/L (ref 12–78)

## 2013-07-05 LAB — CBC
HCT: 28.5 % — ABNORMAL LOW (ref 35.0–47.0)
MCHC: 33.2 g/dL (ref 32.0–36.0)
MCV: 84 fL (ref 80–100)
Platelet: 222 10*3/uL (ref 150–440)
WBC: 7.6 10*3/uL (ref 3.6–11.0)

## 2013-07-05 LAB — TROPONIN I: Troponin-I: <0.02 ng/mL

## 2013-07-05 LAB — CK TOTAL AND CKMB (NOT AT ARMC)
CK, Total: 209 U/L (ref 21–215)
CK-MB: 0.7 ng/mL (ref 0.5–3.6)

## 2013-07-05 LAB — PRO B NATRIURETIC PEPTIDE: B-Type Natriuretic Peptide: 956 pg/mL — ABNORMAL HIGH (ref 0–450)

## 2013-07-06 LAB — BASIC METABOLIC PANEL
Anion Gap: 6 — ABNORMAL LOW (ref 7–16)
BUN: 36 mg/dL — ABNORMAL HIGH (ref 7–18)
Calcium, Total: 8.4 mg/dL — ABNORMAL LOW (ref 8.5–10.1)
Chloride: 99 mmol/L (ref 98–107)
Co2: 34 mmol/L — ABNORMAL HIGH (ref 21–32)
Creatinine: 1.85 mg/dL — ABNORMAL HIGH (ref 0.60–1.30)
Potassium: 3.4 mmol/L — ABNORMAL LOW (ref 3.5–5.1)
Sodium: 139 mmol/L (ref 136–145)

## 2013-07-06 LAB — CBC WITH DIFFERENTIAL/PLATELET
Basophil #: 0 10*3/uL (ref 0.0–0.1)
Basophil %: 0.2 %
Eosinophil #: 0 10*3/uL (ref 0.0–0.7)
Lymphocyte %: 4.9 %
MCHC: 34.9 g/dL (ref 32.0–36.0)
MCV: 84 fL (ref 80–100)
Neutrophil %: 93.5 %
RBC: 3.08 10*6/uL — ABNORMAL LOW (ref 3.80–5.20)
WBC: 7 10*3/uL (ref 3.6–11.0)

## 2013-07-06 LAB — TROPONIN I
Troponin-I: <0.02 ng/mL
Troponin-I: <0.02 ng/mL

## 2013-07-06 LAB — CK TOTAL AND CKMB (NOT AT ARMC): CK-MB: 1.5 ng/mL (ref 0.5–3.6)

## 2013-07-08 LAB — BASIC METABOLIC PANEL
BUN: 46 mg/dL — ABNORMAL HIGH (ref 7–18)
Calcium, Total: 8.4 mg/dL — ABNORMAL LOW (ref 8.5–10.1)
Chloride: 96 mmol/L — ABNORMAL LOW (ref 98–107)
Creatinine: 1.92 mg/dL — ABNORMAL HIGH (ref 0.60–1.30)
EGFR (Non-African Amer.): 25 — ABNORMAL LOW
Glucose: 190 mg/dL — ABNORMAL HIGH (ref 65–99)
Osmolality: 291 (ref 275–301)

## 2013-07-08 LAB — POTASSIUM: Potassium: 3.8 mmol/L (ref 3.5–5.1)

## 2013-07-09 LAB — BASIC METABOLIC PANEL
Calcium, Total: 8.5 mg/dL (ref 8.5–10.1)
Chloride: 98 mmol/L (ref 98–107)
EGFR (Non-African Amer.): 26 — ABNORMAL LOW
Glucose: 230 mg/dL — ABNORMAL HIGH (ref 65–99)
Osmolality: 297 (ref 275–301)
Potassium: 3.5 mmol/L (ref 3.5–5.1)

## 2013-07-12 ENCOUNTER — Inpatient Hospital Stay: Payer: Self-pay | Admitting: Student

## 2013-07-12 LAB — CK TOTAL AND CKMB (NOT AT ARMC)
CK, Total: 152 U/L (ref 21–215)
CK, Total: 142 U/L (ref 21–215)
CK, Total: 150 U/L (ref 21–215)
CK-MB: 0.9 ng/mL (ref 0.5–3.6)
CK-MB: 1.1 ng/mL (ref 0.5–3.6)
CK-MB: 1.1 ng/mL (ref 0.5–3.6)

## 2013-07-12 LAB — URINALYSIS, COMPLETE
Bacteria: NONE SEEN
Bilirubin,UR: NEGATIVE
Blood: NEGATIVE
Hyaline Cast: 5
Ketone: NEGATIVE
Leukocyte Esterase: NEGATIVE
Nitrite: NEGATIVE
Ph: 5 (ref 4.5–8.0)
Protein: NEGATIVE
RBC,UR: 1 /HPF (ref 0–5)
WBC UR: 1 /HPF (ref 0–5)

## 2013-07-12 LAB — CBC
HGB: 9.1 g/dL — ABNORMAL LOW (ref 12.0–16.0)
MCH: 27.8 pg (ref 26.0–34.0)
RDW: 20.7 % — ABNORMAL HIGH (ref 11.5–14.5)
WBC: 11.5 10*3/uL — ABNORMAL HIGH (ref 3.6–11.0)

## 2013-07-12 LAB — BASIC METABOLIC PANEL
BUN: 56 mg/dL — ABNORMAL HIGH (ref 7–18)
Creatinine: 1.97 mg/dL — ABNORMAL HIGH (ref 0.60–1.30)
EGFR (African American): 28 — ABNORMAL LOW
EGFR (Non-African Amer.): 24 — ABNORMAL LOW
Glucose: 244 mg/dL — ABNORMAL HIGH (ref 65–99)
Sodium: 137 mmol/L (ref 136–145)

## 2013-07-12 LAB — PRO B NATRIURETIC PEPTIDE: B-Type Natriuretic Peptide: 1580 pg/mL — ABNORMAL HIGH (ref 0–450)

## 2013-07-13 ENCOUNTER — Ambulatory Visit: Payer: Self-pay | Admitting: Internal Medicine

## 2013-07-13 LAB — CBC WITH DIFFERENTIAL/PLATELET
Basophil #: 0 10*3/uL (ref 0.0–0.1)
Eosinophil #: 0 10*3/uL (ref 0.0–0.7)
Eosinophil %: 0 %
HGB: 8.7 g/dL — ABNORMAL LOW (ref 12.0–16.0)
Lymphocyte #: 0.2 10*3/uL — ABNORMAL LOW (ref 1.0–3.6)
MCHC: 32.7 g/dL (ref 32.0–36.0)
MCV: 85 fL (ref 80–100)
Monocyte #: 0.4 x10 3/mm (ref 0.2–0.9)
Monocyte %: 5.6 %
Neutrophil #: 6.8 10*3/uL — ABNORMAL HIGH (ref 1.4–6.5)
RBC: 3.12 10*6/uL — ABNORMAL LOW (ref 3.80–5.20)

## 2013-07-13 LAB — COMPREHENSIVE METABOLIC PANEL
Albumin: 3 g/dL — ABNORMAL LOW (ref 3.4–5.0)
Anion Gap: 9 (ref 7–16)
Chloride: 92 mmol/L — ABNORMAL LOW (ref 98–107)
Co2: 39 mmol/L — ABNORMAL HIGH (ref 21–32)
Creatinine: 1.75 mg/dL — ABNORMAL HIGH (ref 0.60–1.30)
EGFR (African American): 32 — ABNORMAL LOW
EGFR (Non-African Amer.): 28 — ABNORMAL LOW
Glucose: 265 mg/dL — ABNORMAL HIGH (ref 65–99)
Potassium: 2.7 mmol/L — ABNORMAL LOW (ref 3.5–5.1)
SGPT (ALT): 77 U/L (ref 12–78)
Total Protein: 5.5 g/dL — ABNORMAL LOW (ref 6.4–8.2)

## 2013-07-13 LAB — PROTIME-INR: INR: 1.1

## 2013-07-13 LAB — LIPID PANEL
HDL Cholesterol: 57 mg/dL (ref 40–60)
Triglycerides: 458 mg/dL — ABNORMAL HIGH (ref 0–200)

## 2013-07-14 LAB — BASIC METABOLIC PANEL
Anion Gap: 8 (ref 7–16)
Calcium, Total: 8.6 mg/dL (ref 8.5–10.1)
Chloride: 93 mmol/L — ABNORMAL LOW (ref 98–107)
EGFR (African American): 41 — ABNORMAL LOW
EGFR (Non-African Amer.): 35 — ABNORMAL LOW
Glucose: 252 mg/dL — ABNORMAL HIGH (ref 65–99)
Potassium: 3 mmol/L — ABNORMAL LOW (ref 3.5–5.1)

## 2013-07-17 LAB — CULTURE, BLOOD (SINGLE)

## 2013-07-28 ENCOUNTER — Ambulatory Visit: Payer: Self-pay | Admitting: Internal Medicine

## 2013-07-28 DEATH — deceased

## 2014-08-03 IMAGING — CR DG ABDOMEN 3V
1 series · 4 of 4 positions shown · non-contrast
Comparison: none

REASON FOR EXAM: abdominal pain, nausea and vomiting
COMMENTS:

PROCEDURE:     DXR - DXR ABDOMEN 3-WAY (INCL PA CXR)  - June 12, 2012  [DATE]
RESULT:     Comparison: 05/21/2012

[Series 1: w chest pa · 0.14mm/px · 4 of 4 slices shown]
[im 1/4]
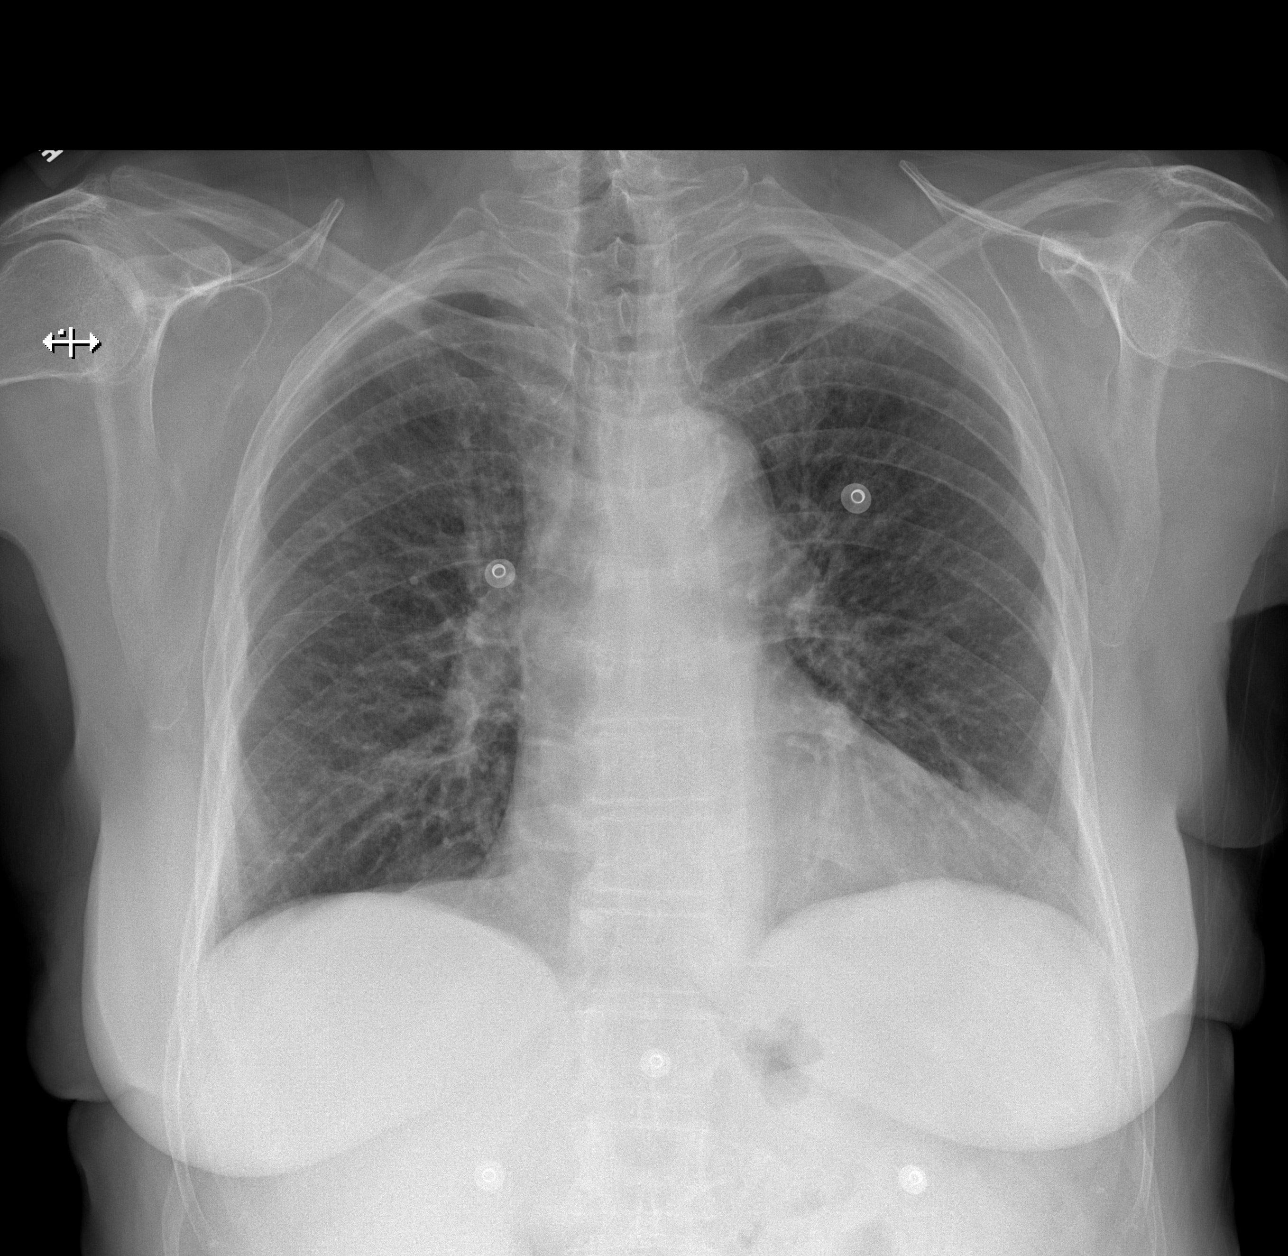
[im 2/4]
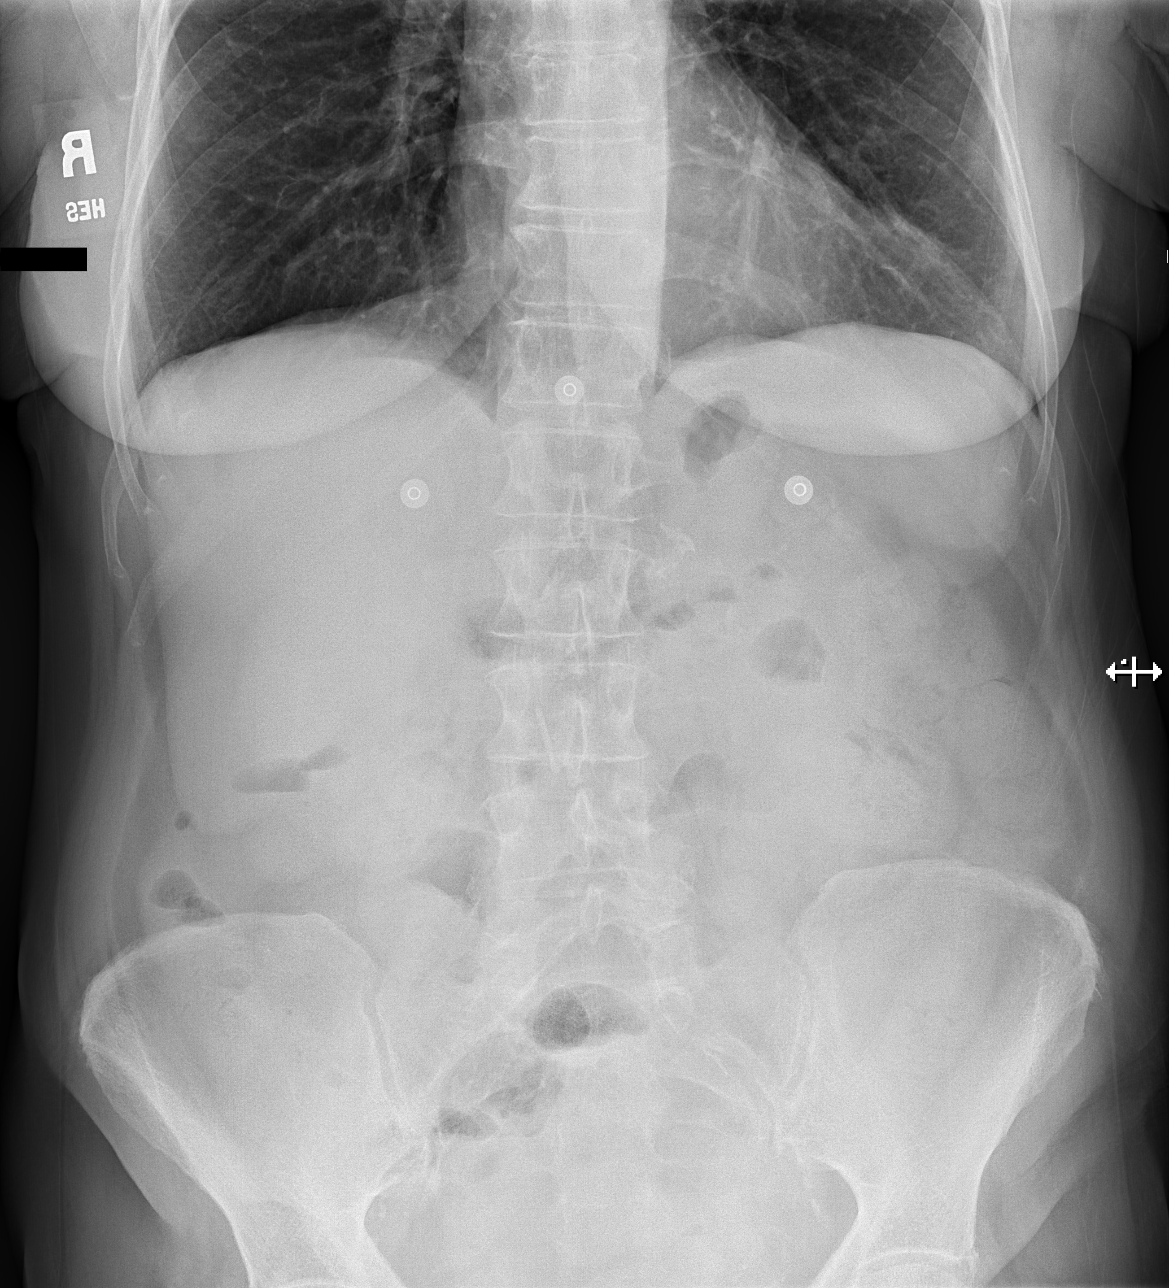
[im 3/4]
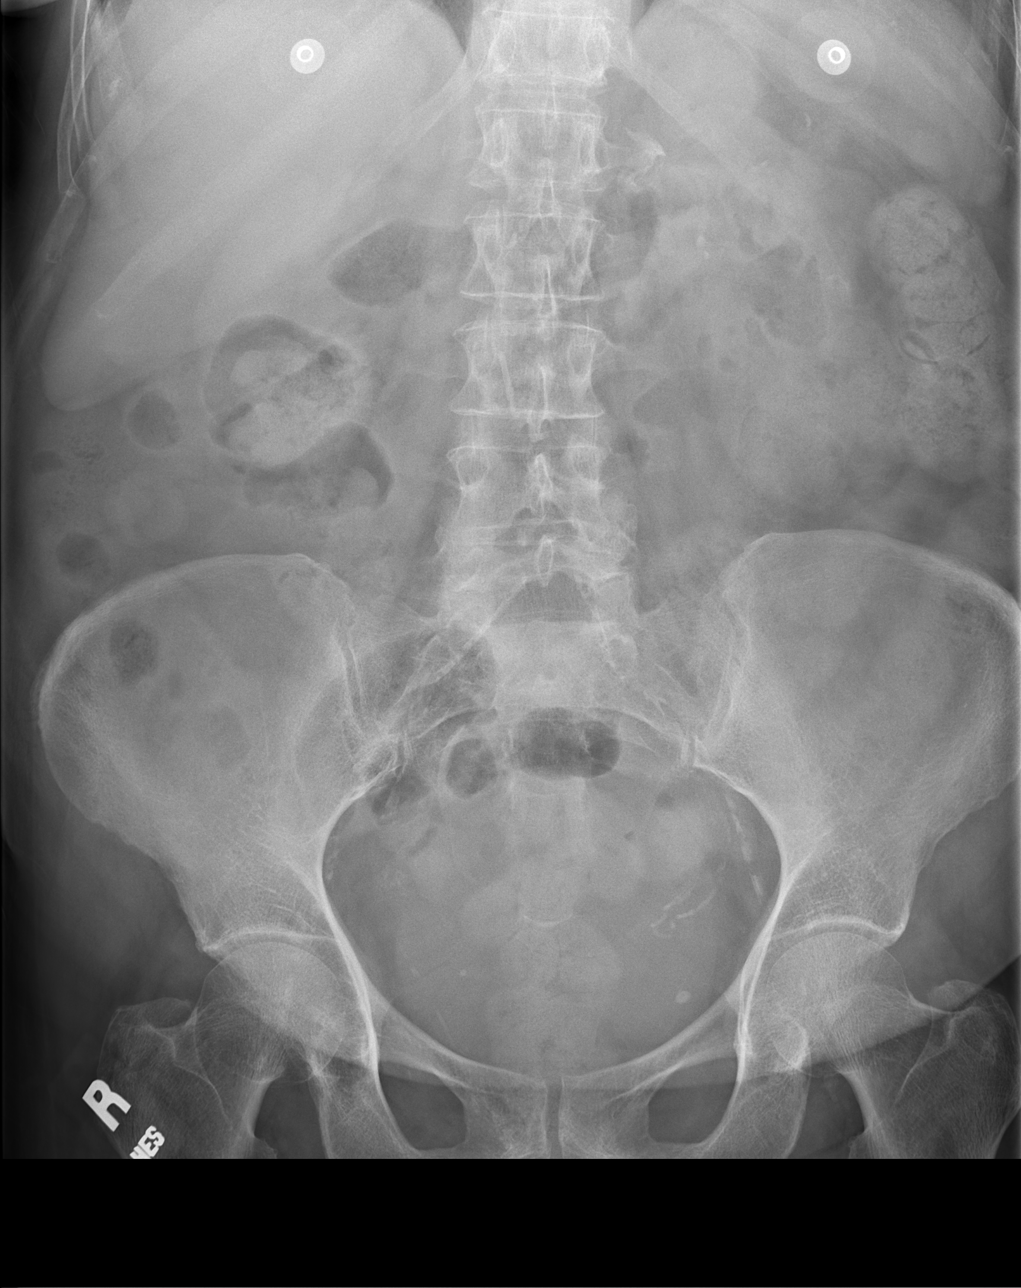
[im 4/4]
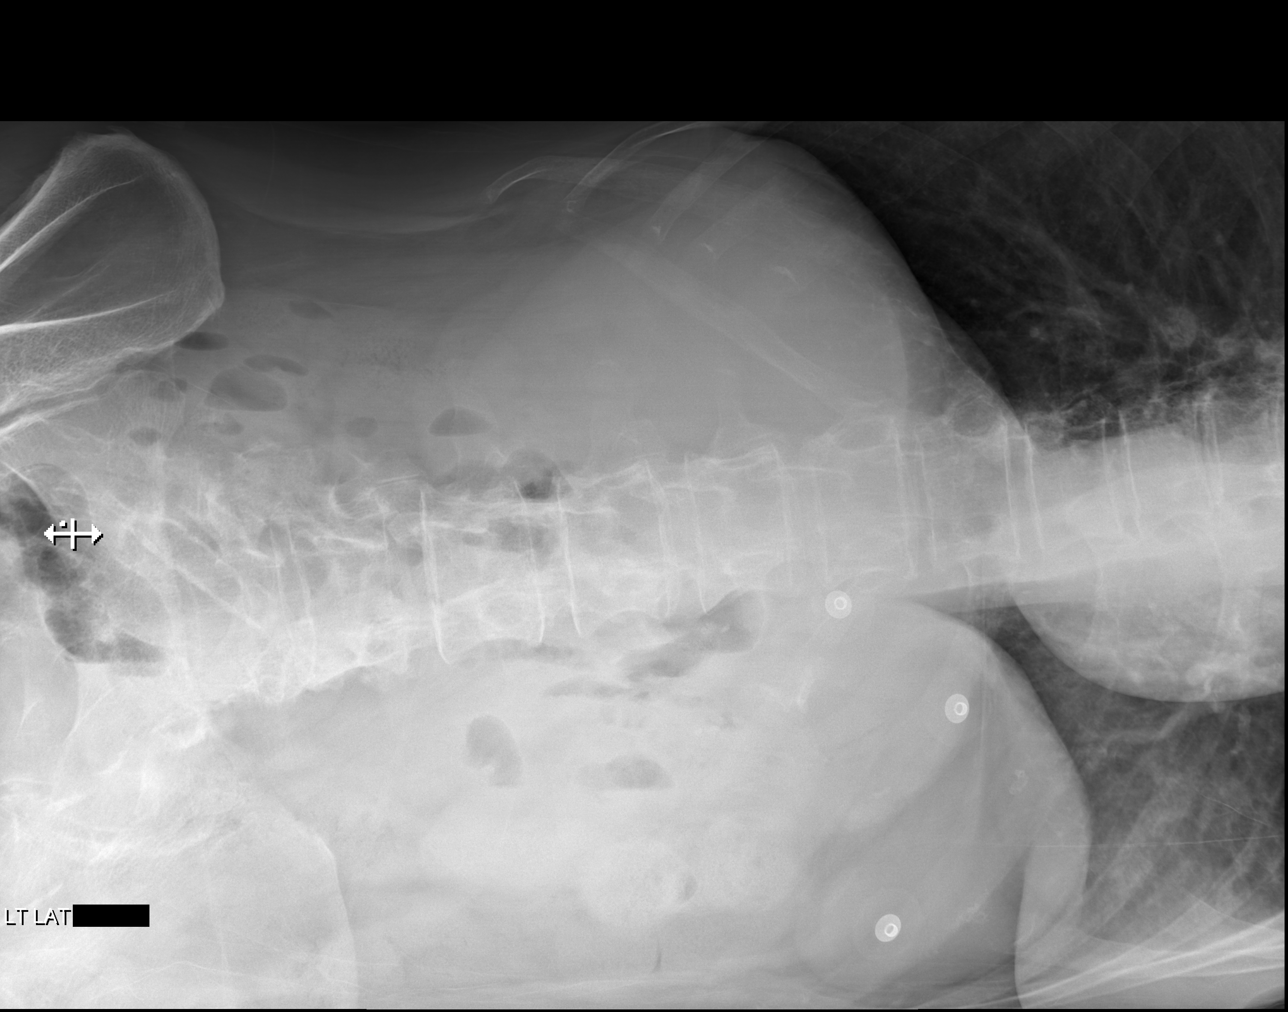

[4 of 4 positions shown; findings below may reference images not displayed]

FINDINGS: The heart and mediastinum are stable. There is suggestion of bilateral
interstitial pulmonary opacities which are similar to prior given
differences in technique. No free intraperitoneal air. Air seen within
nondilated small and large bowel.
IMPRESSION: 1. Nonobstructed bowel gas pattern.
2. Bilateral interstitial pulmonary opacities are similar to prior.
Differential would include interstitial pulmonary edema, atypical infection,
as well is chronic interstitial lung disease.

[REDACTED]

## 2015-01-14 NOTE — Discharge Summary (Signed)
PATIENT NAME:  [name], [name] MR#:  [id number] DATE OF BIRTH:  May 12, 1935  DATE OF ADMISSION:  07/27/2012 DATE OF DISCHARGE:  07/31/2012  PRIMARY CARE PHYSICIAN: Dr Mariana Kaufmanobin in [place].   FINAL DIAGNOSES:  1. Pneumonia, healthcare acquired.  2. C. difficile colitis.  3. Hyponatremia from dehydration.  4. History of congestive heart failure. No signs of congestive heart failure on this admission.  5. Chronic obstructive pulmonary disease.  6. Atrial fibrillation.  7. Salicylate poisoning. Do not take Advil, Aleve, Naprosyn. Do not take Aldactone. Do not take Lasix.   MEDICATIONS ON DISCHARGE:  Omega-3 fatty acid 1000 mg daily. Combivent 2 puffs every six hours as needed.  Multivitamin 1 tablet daily. Lisinopril 10 mg daily. Aspirin 81 mg daily. Advair HFA 115/21 micrograms 2 puffs twice a day, amiodarone 400 mg daily. Zofran 4 mg every eight hours as needed for nausea. Levaquin 750 mg daily for four days, metronidazole 250 mg every eight hours for 14 days, Seroquel 25 mg at bedtime. Amlodipine 5 mg daily. Nicotine patch 14 mcg chest wall daily, hydralazine 25 mg 4 times a day.   Home health physical therapy and nurse.   DIET: Regular, regular consistency.   REFERRAL: Home health PT and R.N., Nephrology Dr. Cherylann RatelLateef in two weeks, Dr. Mariana Kaufmanobin in one week.   REASON FOR ADMISSION: The patient was admitted 07/27/2012, discharged 07/31/2012. She came in with confusion and cough.  HISTORY OF PRESENT ILLNESS:  80 year old female with history of hypertension and chronic obstructive pulmonary disease, heart failure, hyponatremia. She presents with cough and confusion. She was found to have a sodium of 119. CT scan of the chest showing pneumonia, elevated salicylate level. The patient was admitted with right lower lobe pneumonia, started on vancomycin, Primaxin, and Zithromax. For diarrhea, stool for C. difficile were sent off. For hyponatremia, she was put on IV fluid hydration. Nephrology consultation was  obtained.   LABORATORY, DIAGNOSTIC, AND RADIOLOGICAL DATA DURING HOSPITAL COURSE: Included a B12 of 695, acetaminophen level six, salicylates  20.4. Urine toxicology negative. Urine culture contamination,  troponin negative. Urinalysis negative. Glucose 89, BUN 12, creatinine 0.89, sodium 119, potassium 4.5, chloride 85, CO2 23. Liver function tests normal range. White blood cell count 26.2, hemoglobin and hematocrit 10.9 and 31.1, platelet count 345. Chest x-ray showed atelectasis versus infiltrate right lower lobe. Blood cultures negative. CT scan of the head: No acute abnormalities. CT scan of the chest, a dense right lung  differential considerations are consolidative infiltrate versus possible atelectasis. More ominous etiologies cannot be excluded. Ammonia level less than 25, uric acid 3.7. Cortisol 14.2. TSH 0.942. Serial sodiums were done. Sodium on the 1st was 123,  on the second sodium was up to 129 and on the second stool for C. difficile was positive, on the third, sodium up to 135, salicylates level down to 3.0 on the third.  HOSPITAL COURSE PER PROBLEM:  1. For the pneumonia was thought to be healthcare acquired was put on aggressive antibiotics with Primaxin and Zithromax and vancomycin. The patient'Carter white count had improved. The patient'Carter respiratory status improved, not on any oxygen. Since the patient C. difficile came back positive, I did switch the patient over to high dose Levaquin and will complete the course.  2. C. difficile colitis, initially thought there was an allergy to  Flagyl and was put on p.o. vancomycin. In speaking with one of the daughters this is just an upset stomach with the Flagyl so she was switched back to low-dose Flagyl  secondary to cost considerations. She will have a 10 day course of Flagyl, finish up 10 after finishing up the Levaquin so she will be on a total of 14 days of Flagyl. The patient was still having slight diarrhea but wanted to go home. She was eating  fine.  3. Hyponatremia. This was believed secondary to dehydration, she was hydrated up with normal saline. Sodium upon discharge 135. The patient was seen in consultation by Dr. Cherylann Ratel, nephrology. I do recommend checking a BMP at follow-up appointment.  4. History of congestive heart failure. No signs of heart failure on this admission, patient was actually given IV fluid hydration. I do recommend stopping the Lasix at this point while having diarrhea.  5. Chronic obstructive pulmonary disease. Respiratory status stable upon discharge. She was kept on her usual inhalers.  6. Atrial fibrillation, she is on amiodarone and aspirin for anticoagulation.  7. Salicylate  poisoning level high when she came in. No real symptoms. Advised not to take Advil, Aleve, Naprosyn and Goody powder, just continue with aspirin alone.   Time Spent ON discharge: 35 minutes.   I do recommend a follow-up CT scan of the chest in eight weeks to ensure clearing of that right lower lobe.     ____________________________ Herschell Dimes. Renae Gloss, MD rjw:ljs D: 07/31/2012 14:24:59 ET T: 07/31/2012 15:01:00 ET JOB#: [id number]  cc: Herschell Dimes. Renae Gloss, MD, <Dictator> Vic Ripper. Mariana Kaufman, MD Salley Scarlet MD ELECTRONICALLY SIGNED 08/05/2012 17:51

## 2015-01-14 NOTE — Discharge Summary (Signed)
PATIENT NAME:  [name], [name] MR#:  [id number] DATE OF BIRTH:  Aug 27, 1935  DATE OF ADMISSION:  05/17/2012 DATE OF DISCHARGE:  05/22/2012  DISCHARGE DIAGNOSIS:  Acute respirator failure due to pulmonary edema secondary to diastolic heart failure on presentation.  HISTORY AND HOSPITAL COURSE:  1. This is a 80 year old female who had history of coronary artery disease, hypertension, dyslipidemia and chronic obstructive pulmonary disease and depression who was admitted to [facility] on 05/03/2012 with a history of headache. After that, she started having some progressive confusion to the point that she was stumbling in the dark and speaking with no sense for what she was brought to [facility]. She started noticing some swelling and her blood pressure was elevated at 222/95 at [facility] ER. She was found to have notable decrease in her sodium with sodium level of 115 and a salicylate level of 0.5. She had a CT scan of the head that showed no intracranial acute abnormalities. The patient was admitted for observation, but she had an event where she needed to be transferred to neuro intensive care unit for hypertonic saline. That was stopped due to really fast rise in her sodium. Due to all these issues, the patient remained hypoxic and oxygen saturation remained in the 30s and 40s, and so the patient was taken to PET scan and a MRI. She was given fentanyl and Versed, then the patient had respiratory failure so she was intubated and she was placed in medical ICU and started on broad-spectrum antibiotics for possible aspiration. She remained for a few days hypotensive and hypoxic. An echo was done which showed normal ejection fraction and normal left ventricular function with mild left ventricular hypertrophy and normal right ventricular systolic function with trivial MR, PR, and TR and mild aortic stenosis. Finally, she improved. She was transferred to the floor and then she was discharged from [facility] to home. She came home and the  next day she had, again, increased shortness of breath and she had to call the ambulance and they brought her to the Emergency Room at [facility]. She was also complaining of chest tightness and shortness of breath when she presented to our Emergency Room, but was not confused at this time and she showed some improvement when she was started on BiPAP in ER. During the hospital stay, she was found to have pleural effusion and for that, thoracentesis was done which showed it is transudate secondary to congestive heart failure exacerbation. She also had pulmonary edema which she had very good improvement after starting on BiPAP and IV Lasix and she was monitored in Critical Care Unit initially, but later on after improvement, transferred to regular floor and she improved gradually to the level where she was not short of breath anymore.  2. The second issue was congestive heart failure due to diastolic dysfunction which, as mentioned above, we managed with IV Lasix and BiPAP and she improved gradually, but later on in the hospital stay we had to decrease the dose of Lasix and we switched it to oral. We had to discontinue actually again because she again started getting hyponatremic due to Lasix use and we switched to spironolactone and then she tolerated it very nicely.   3. Elevated troponin. Her troponin is borderline elevated. Cardiology consult was also done during the hospital stay, but after followup, non-ST elevation myocardial infarction was ruled out because there were not any rise in the troponin anymore and we attributed mild rise in the troponin to  the stress due to congestive heart failure and pulmonary edema. She was managed on aspirin, Plavix, ACE inhibitor, beta blocker, statin and spironolactone.  4. Severe hyponatremia. She improved after getting Lasix and having diuresis, but later in the hospital stay, it started falling again which we managed by discontinuing Lasix and  started her on spironolactone and she showed improvement and finally, her sodium was normal.  5. Coronary artery disease. The patient was continued on her old cardiac medications during the hospital stay.  6. Atrial fibrillation, which was [facility] at [facility] and we managed it with the same medication, amiodarone 200 milligrams here. 7. Hypertension. We continued her on Coreg and Lisinopril and we also added spironolactone for her congestive heart failure.  8. Depression. We continued citalopram. 9. Anemia. The patient does not have any significant complaint in the past and initially she received one unit of packed red blood cells. When she came in, she was in the Critical Care Unit.  Then her hemoglobin remained stable and she remained without any symptoms, so we discharged her for further workup to be done by primary care physician.   DISCHARGE CONDITION: Good.   CODE STATUS ON DISCHARGE: FULL CODE.   ALLERGIES: Lodine, Augmentin, Prednisone, Doxycycline, Zyrtec, Neurontin, Hydrochlorothiazide, Flagyl, Tylenol, Claritin, Ultracet.  DISCHARGE MEDICATIONS:  1. Omega 3 fatty acid 1000 milligrams oral capsule once daily. 2. Advair Diskus 250 micrograms/50 micrograms powder one puff inhaled daily every 12 hours.  3. Combivent 18 micrograms 103 micrograms inhaled aerosol two puffs every six hours.  4. Multivitamin tablet. 5. Omeprazole 40 milligrams once daily. 6. Proventil 90 micrograms inhaled aerosol every six hours as needed. 7. Aspirin 81 milligrams once daily. 8. Lisinopril 10 milligrams once daily.  9. Spironolactone 25 milligrams once daily.  10. Amiodarone 200 milligram tablets once daily.   We stopped the Hydrochlorothiazide and Lasix because of complaint of hypothyroidism.   DISCHARGE INSTRUCTIONS: Home health services were recommended. Physical therapy, occupational therapy, speech therapy consult which was arranged by social worker case management on discharge.   HOME OXYGEN  REQUIREMENT: No.   DIET: Regular, low carbohydrate and low fat diet.   DIET CONSISTENCY: Regular.   SPECIAL INSTRUCTION ON DIET: A total of one liter of fluid in a day.   ACTIVITY LIMITATIONS: None.    TIME FRAME TO FOLLOWUP WITH PMD: Within one week with Dr. Nilda SimmerKristi Smith who is her family physician.    ____________________________ Hope PigeonVaibhavkumar G. Elisabeth PigeonVachhani, MD vgv:ap D: 05/26/2012 16:04:50 ET T: 05/27/2012 13:51:14 ET JOB#: [id number]  cc: Hope PigeonVaibhavkumar G. Elisabeth PigeonVachhani, MD, <Dictator> [name], MD Altamese DillingVAIBHAVKUMAR Giancarlo Askren MD ELECTRONICALLY SIGNED 06/14/2012 14:36

## 2015-01-14 NOTE — Consult Note (Signed)
PATIENT NAME:  [name], [name] MR#:  [id number] DATE OF BIRTH:  09-20-1935  DATE OF CONSULTATION:  05/17/2012  REFERRING PHYSICIAN:  Krystal Eaton, MD CONSULTING PHYSICIAN:  Lamar Blinks, MD  PRIMARY CARE PHYSICIAN: Nilda Simmer, MD  REASON FOR CONSULTATION: Known coronary artery disease with previous stents, hypertension, hyperlipidemia, chronic obstructive pulmonary disease, and anemia with acute onset of shortness of breath consistent with unstable angina.   CHIEF COMPLAINT: "I got short of breath."   HISTORY OF PRESENT ILLNESS: This is a 80 year old female with known coronary artery disease status post previous stenting but no apparent previous myocardial infarction. The patient has had hypertension and is on the appropriate medication as well as hyperlipidemia well controlled. She has had some chronic obstructive pulmonary disease in the past for which she has smoked in the past but has not smoked in recent months. The patient has had new onset of substernal mild chest pressure mainly with shortness of breath and cough for which she has had low oxygen levels. When seen in the emergency room, she had an elevation of troponin of 0.27 with an EKG showing normal sinus rhythm and no evidence of acute myocardial infarction. The patient also had anemia with a hemoglobin of 8.5. She did have some hypoxia and was placed on a BiPAP machine for which now her oxygenation is good and she feels much better. Her BNP was 2947. The patient has had no evidence of previous myocardial infarction or heart failure, but there is concern for unstable angina.   REVIEW OF SYSTEMS: The review of systems is negative for vision change, ringing in the ears, hearing loss, cough, congestion, heartburn, nausea, vomiting, diarrhea, bloody stools, stomach pain, extremity pain, leg weakness, cramping of the buttocks, known blood clots, headaches, blackouts, dizzy spells, nosebleeds, congestion, trouble swallowing, frequent  urination, urination at night, muscle weakness, numbness, anxiety, depression, skin lesions, or skin rashes.   PAST MEDICAL HISTORY:  1. Known coronary artery disease status post previous PCI and stent placement.  2. Hypertension.  3. Hyperlipidemia.  4. Chronic obstructive pulmonary disease.  5. Anemia.   FAMILY HISTORY: No family members with early onset of cardiovascular disease.   SOCIAL HISTORY: Has remote tobacco use, currently denies alcohol use.   ALLERGIES: No known drug allergies.   CURRENT MEDICATIONS: As listed.   PHYSICAL EXAMINATION:   VITAL SIGNS: Blood pressure 110/68 bilaterally and heart rate is 72 upright and reclining and regular.   GENERAL: She is a well appearing female in no acute distress.   HEENT: No icterus, thyromegaly, ulcers, hemorrhage, or xanthelasma.   HEART: Regular rate and rhythm. Normal S1 and S2. Distant heart sounds. Point of maximal impulse is diffuse. Carotid upstroke is normal without bruit. Jugular venous pressure is normal.   LUNGS: Lungs have diffuse expiratory wheezes and few basilar crackles.   ABDOMEN: Soft and nontender without hepatosplenomegaly or masses. Abdominal aorta is normal size without bruit.   EXTREMITIES: 2+ radial, femoral, and dorsal pedal pulses with trace lower extremity edema. No cyanosis, clubbing, or ulcers.   NEUROLOGIC: She is oriented to time, place, and person with normal mood and affect.   ASSESSMENT: This is a 80 year old female with coronary artery disease, hypertension, hyperlipidemia, chronic obstructive pulmonary disease, and anemia with acute onset of respiratory distress without current evidence of myocardial infarction although troponin elevation consistent with demand ischemia at this point needing further evaluation.    RECOMMENDATIONS:  1. Continue surveillance of ECG and enzymes to assess for possible myocardial  infarction.  2. Nitrates as necessary if the patient has any chest discomfort.   3. Oxygenation with BiPAP and look for improvement of current symptoms and need for any further intervention.  4. Echocardiogram for LV systolic dysfunction and valvular heart disease contributing to her current issues. 5. Potential addition of Lasix for possible pulmonary edema and mild heart failure symptoms.  6. Further investigation of anemia as a cause of above and concerns for bleeding potential and bleeding complications. 7. Antibiotics if necessary for chronic obstructive pulmonary disease.  8. Further diagnostic testing and treatment options depending on how the patient improves with symptoms listed above. ____________________________ Lamar BlinksBruce J. Galvin Aversa, MD bjk:slb D: 05/17/2012 13:29:08 ET T: 05/17/2012 15:01:19 ET JOB#: [id number]  cc: Lamar BlinksBruce J. Azad Calame, MD, <Dictator> Lamar BlinksBRUCE J Trinitee Horgan MD ELECTRONICALLY SIGNED 05/19/2012 7:45

## 2015-01-14 NOTE — H&P (Signed)
PATIENT NAME:  [name], STANKOVICH MR#:  [id number] DATE OF BIRTH:  1935-03-02  DATE OF ADMISSION:  07/27/2012  PRIMARY CARE PHYSICIAN: Dr. Nilda Simmer  CHIEF COMPLAINT: Confusion, cough.   HISTORY OF PRESENTING ILLNESS: 80 year old Caucasian female patient with history of hypertension, chronic obstructive pulmonary disease, diastolic congestive heart failure, chronic mild hyponatremia and previous salicylate poisoning presents to the Emergency Room brought in by her daughters with cough and confusion. Patient was seen by her primary care physician three days back for the cough, started on Levaquin and blood work sent. Patient had elevated WBC and low sodium, was sent to the hospital. Here her WBC is 26.2 with sodium of 119 with confusion. CT of the chest showing right lower lobe pneumonia and patient is being admitted to the hospitalist service for further work-up and treatment.   Patient had hyponatremia in the past which was as low as 128 but presently is much worse at 119. She has had a cough with clear productive sputum. She generally tends to complain of pain all over her body and use to use quite a bit of Goody powder everyday. Patient was admitted with salicylate positive the last time in September and since then her daughters have stopped her from using any Goody powder. Patient seems to have good long-term memory but has had problems with short-term memory, tends to forget the date. Presently is oriented to place and person but not to time.   Patient had a prolonged stay in the hospital in August 2013 when she had congestive heart failure, atrial fibrillation and bilateral pleural effusion, status post thoracentesis. Prior to this she was at [facility] for hyponatremia and aspiration pneumonia.   Most of the history is given by her daughter who was a Engineer, civil (consulting) here at [facility] in the past.   PAST MEDICAL HISTORY:  1. Hypertension.  2. Chronic obstructive pulmonary disease.   3. Diastolic congestive heart failure.  4. Chronic hyponatremia.  5. Prior salicylate poisoning.   6. Chronic atrial fibrillation.   ALLERGIES: Augmentin, Claritin, doxycycline, Flagyl, hydrochlorothiazide, iodine, Neurontin, prednisone, Tylenol, Ultracet, Zyrtec. Daughter mentions that most of these medications tend to cause nausea and vomiting and Versed and fentanyl cause respiratory depression.   SOCIAL HISTORY: Patient smokes 1/2 pack a day. No alcohol. No illicit drugs. Lives at home with her husband and daughter. Ambulates on her own.   FAMILY HISTORY: Father died from prostate cancer. Mother died from complications of heart disease.   CURRENT MEDICATIONS:  1. Lisinopril 10 mg daily.   2. Multivitamin 1 tablet daily. 3. Omega-3 fatty acid 1 tablet daily.  4. Spironolactone 25 mg daily.  5. Combivent 2 puffs q.6 hours as needed.  6. Aspirin 81 mg daily.  7. Amiodarone 400 mg oral daily.  8. Advair 1 puff b.i.d.  9. Combivent Respimat 1 puff 4 times a day.   REVIEW OF SYSTEMS: CONSTITUTIONAL: No fever, weight gain, weight loss. EYES: No blurred vision, double vision. ENT: No tinnitus, postnasal drip, redness of oropharynx. RESPIRATORY: Positive for cough which is productive clear, positive for chronic obstructive pulmonary disease. No hemoptysis. Chronic dyspnea. CARDIOVASCULAR: No chest pain. No orthopnea. No palpitations. GASTROINTESTINAL: Positive for nausea. No vomiting. Does have diarrhea for two days. No abdominal pain. GENITOURINARY: No dysuria, hematuria. ENDOCRINE: No polyuria, nocturia. No heat or cold intolerance. HEMATOLOGIC: No anemia, easy bruising, bleeding. SKIN: No rash, lesions. MUSCULOSKELETAL: No arthritis. No swelling but does complain of generalized body pain. NEUROLOGIC: No numbness, tingling ataxia  or seizures. PSYCHIATRIC: No anxiety, depression, or ADD.   PHYSICAL EXAMINATION:  VITAL SIGNS: Temperature 98.3, pulse 86, respirations 16, blood pressure 140/66,  saturating 98% on room air.   GENERAL: Obese, Caucasian female patient lying in bed, comfortable, conversational, cooperative with exam.   PSYCH: Alert, oriented to person and place but not to time. Has a flat affect. Poor judgment.   HEENT: Atraumatic, normocephalic. Oral mucosa dry and pink. External ears and nose normal. No pallor. No icterus. Pupils bilaterally equal and reactive to light.   NECK: Supple. No thyromegaly. No palpable lymph nodes. Trachea midline. No carotid bruits, JVD.   CARDIOVASCULAR: S1, S2 regular rate and rhythm without any murmurs.   RESPIRATORY: Bilateral basal crackles, right more than the left. No prolonged expiration. No wheezing, not using accessory muscles.   GASTROINTESTINAL: Soft abdomen, nontender. Bowel sounds present. No hepatosplenomegaly palpable.   SKIN: Warm and dry. No petechiae, rash, ulcers.   MUSCULOSKELETAL: No joint swelling, redness, effusion of the large joints. Normal muscle tone.   NEUROLOGICAL: Moves all four extremities. Motor strength equal and symmetric on both sides. Sensation to fine touch intact all over.   LABORATORY, DIAGNOSTIC AND RADIOLOGICAL DATA: Laboratory studies show glucose 89, BUN 12, creatinine 0.8, sodium 119, chloride 85, GFR greater than 60, albumin 3.1, troponin less than 0.02, WBC 26.2, hemoglobin 10.9, platelets 345, differential not available.   Urinalysis negative for WBC or bacteria. Serum salicylate level of 20.4.   CT of the head without contrast shows no acute abnormalities.   CT of the chest with contrast shows prominent right hilar and mediastinal lymph nodes which is chronic and consolidative density right lung base, which could be atelectasis or a mass.   EKG shows normal sinus rhythm. No atrial fibrillation.   ASSESSMENT AND PLAN:  1. Right lower lobe pneumonia with recent hospital stay in the last three months. Will start patient on treatment for healthcare acquired pneumonia with vancomycin,  Primaxin and azithromycin. Patient could have aspiration pneumonia considering her chronic nausea. Presently not in any acute respiratory failure. O2 support as needed. Nebulizers as needed. Send for sputum and blood cultures.  2. Diarrhea with elevated white count. Patient could have C. difficile with her recent antibiotic use. Will check for Clostridium difficile. Her daughter, who is a Engineer, civil (consulting), mentions that the stool did not look like Clostridium difficile. Will await the results.  3. Hyponatremia. Patient has had chronic hyponatremia as low as 128. This is much worse than her past sodiums. This seems to be chronic which has had happen over many days. Will not need 3% saline. Will start her on gentle normal saline, hold her Lasix. Continue the spironolactone patient is on. Will consult nephrology for further help. Will get urine sodium creatinine.  4. Diastolic congestive heart failure. No acute needs. Patient will be on IV fluids for hyponatremia. Monitor for any fluid overload.  5. Paroxysmal atrial fibrillation. Presently patient is in normal sinus rhythm, not on any anticoagulation secondary to high risk of falls. Follows with her cardiologist as outpatient.  6. Chronic obstructive pulmonary disease, stable. Nebs as needed.  7. Tobacco abuse.  8. Hypertension. Continue home medications.  9. Acute encephalopathy secondary to pneumonia infection. I do not think her hyponatremia is playing a significant role considering it is chronic but needs to be monitored.  10. CODE STATUS: FULL CODE.  11. Deep vein thrombosis prophylaxis with Lovenox.   TIME SPENT: Time spent today on this case was 45 minutes with more  than 50% time spent in coordination of care.  ____________________________ Molinda BailiffSrikar R. Barnabas Henriques, MD srs:cms D: 07/27/2012 21:29:38 ET T: 07/28/2012 05:49:13 ET JOB#: [id number] cc: Wardell HeathSrikar R. Ancel Easler, MD, <Dictator> [name], MD Orie FishermanSRIKAR R Nga Rabon MD ELECTRONICALLY SIGNED 07/28/2012 13:29

## 2015-01-14 NOTE — Discharge Summary (Signed)
PATIENT NAME:  [name], [name] MR#:  [id number] DATE OF BIRTH:  Dec 26, 1934  DATE OF ADMISSION:  06/12/2012 DATE OF DISCHARGE:  06/13/2012  PRIMARY CARE PHYSICIAN: Nilda SimmerKristi Smith, MD    DISCHARGE DIAGNOSES:  1. Altered mental status. 2. Salicylate poison.  3. Hyponatremia. 4. Hyperkalemia. 5. Chronic obstructive pulmonary disease. 6. Congestive heart failure.   HOME MEDICATIONS:  1. Omega-3 1000 mg capsule, 1 capsule p.o. daily.  2. Combivent 18 mcg/103 mcg inhalation, 2 puffs every 6 hours p.r.n.  3. Multivitamin 1 tablet p.o. daily.  4. Lisinopril 10 mg p.o. daily.  5. Spironolactone  25 mg p.o. daily.  6. Aspirin 81 mg p.o. daily. 7. Amiodarone 200 mg, 2 tablets p.o. daily.  8. Advair HFA 150 mcg/21 mcg inhalation, 2 puffs b.i.d.   DIET: Low sodium diet.   ACTIVITY: As tolerated.   FOLLOW-UP CARE:  1. Follow up with PCP within one week.  2. Resume Home Health.   REASON FOR ADMISSION: Altered mental status, nausea, vomiting.   HOSPITAL COURSE: The patient is a 80 year old Caucasian female with history of hypertension, chronic obstructive pulmonary disease, chronic hyponatremia, and previous salicylate poisoning, presented to the ED with confusion, nausea, and vomiting.  According to the patient'Carter daughter, the patient chronically has complaints of headache and is addicted to [employer] and takes many [employer] every single day. The patient was noted to have confusion and was brought to the ED for further evaluation. Her salicylate level was high at 44.5, and she was noted to be mildly acidotic. She was admitted for altered mental status and salicylate poisoning. At admission, the patient has been treated with IV fluid support and bicarbonate according to Poison Control Department'Carter recommendation. The patient'Carter salicylate level decreased to 27.7. In addition, we checked tylenol level which is normal. The patient'Carter liver function is normal. No tinnitus. Today, the patient is  alert, awake, oriented with no complaints at all. The patient is clinically stable and will be discharged to home today.   I discussed the patient'Carter discharge plan with the patient, the patient'Carter daughter, nurse, and case Production designer, theatre/television/filmmanager.   TIME SPENT: About 38 minutes.   ____________________________ Shaune PollackQing Haiven Nardone, MD qc:cbb D: 06/13/2012 17:40:37 ET T: 06/14/2012 12:16:00 ET JOB#: [id number]  cc: Shaune PollackQing Tyvon Eggenberger, MD, <Dictator> [name], MD Shaune PollackQING Kilo Eshelman MD ELECTRONICALLY SIGNED 06/15/2012 14:52

## 2015-01-14 NOTE — H&P (Signed)
PATIENT NAME:  [name], [name] MR#:  [id number] DATE OF BIRTH:  07/01/1935  DATE OF ADMISSION:  05/17/2012  NOTE: The time I began taking care of the patient was 11:00 a.m. until now.   PRIMARY CARE PHYSICIAN: Nilda Simmer, MD  REFERRING PHYSICIAN: Maricela Bo, MD  REASON FOR ADMISSION: Acute respiratory failure with pulmonary edema.   HISTORY OF PRESENT ILLNESS: [name] is a nice 80 year old female who has a history of coronary artery disease, hypertension, dyslipidemia, chronic obstructive pulmonary disease, and depression who was admitted to [facility] on 05/03/2012 with a history of headache for what the patient was taking BC Powder. After that she started having some progressive confusion to the point that she was stumbling in the dark and speaking with no sense for what she was brought to [facility]. She started noticing some swelling and her blood pressure was elevated at 222/95 at that moment. She was found to have notable decrease in her sodium with sodium level of 115 and a salicylate level of 0.5. She had a CT scan that showed no an intracranial acute abnormalities. The patient was admitted for observation and unfortunately she had an event where she needed to be transferred to neuro intensive care unit for hypertonic saline. This was stopped due to really fast rise in her sodium. Due to all these issues and the patient remaining hypoxic with oxygen saturations in the 30s and 40s, the patient was taken to get a PET scan and a MRI. She had fentanyl and Versed and apparently she went into respiratory arrest for what she needed to be intubated. She was put on broad-spectrum antibiotics for the possibility of aspiration, but mostly she was treated because of pulmonary edema. She became hypotensive and hypoxic. An echo was done and it showed pretty much normal ejection fraction and normal left ventricular function with mild left ventricular hypertrophy and normal right ventricular systolic function with  trivial MR, PR, and TR regurgitation and mild aortic stenosis. Ejection fraction is estimated at about 55%. The patient apparently remained intubated for at least three days and in the critical care unit for seven days. She was transferred to the floor and remained there for a day and a half. She was discharged last night and when she got home she started to have increased shortness of breath. She made it up to this morning, but this morning the shortness of breath was so intense that she could not tolerate it for what she was brought to the ER. She states that she has been having a little bit of chest tightness and pain that is located mostly on the lower part of her anterior rib cage as well as on the right long posterior areas.  The patient has not been confused and she states that after she came over here and she was put on the BiPAP she started to improve. There have not been any changes in her mental status and her vital signs have been stable pretty much with a blood pressure of 192/95 and a pulse of 74. Other events that are important to mention is the patient developed atrial fibrillation that resolved dizziness, this was at [facility], and she was also diagnosed with salicylate overdose and that was part of the reason for her altered mental status and respiratory failure, although her gases did not seem to show any significant acidosis. Her hypertension resolved, her hyponatremia resolved for the most, and she was hyperglycemic due to the fact that she received large amounts of  steroids. At this moment, the patient is going to be admitted in the critical care unit. She is critically, looking very sick with significant respiratory distress and use of accessory muscles with respiratory failure with potential of worsening and needing to be intubated. At this moment, we are going to give her a trial off BiPAP. If she is not improving and if she feels agitated still, we are going to go ahead and intubate her and she  agrees with this.   PAST MEDICAL HISTORY:  1. Coronary artery disease status post stent.  2. Chronic obstructive pulmonary disease.  3. Tobacco abuse.  4. Hypertension.  5. Chronic anemia.  6. Congestive heart failure, diastolic, with an ejection fraction of 55%. This is new onset since she was [facility].  7. Atrial fibrillation, now resolved. The patient is on amiodarone.  8. Dyslipidemia.  9. Hyponatremia, now improved.  10. Status post salicylate overdose.  11. Depression, controlled.  12. Hyperglycemia, induced by steroids.  13. Gastroesophageal reflux disease.  ALLERGIES: The patient is allergic to Augmentin, Claritin, doxycycline, Flagyl, hydrochlorothiazide, loratadine, Neurontin, prednisone, Tylenol, Ultracet, and Zyrtec.  SOCIAL HISTORY: The patient is a heavy smoker. She smokes one pack a day. She has been smoking for over 60 years. She is willing to quit right now. She has not smoked since she has been discharged from the hospital. I spent about 10 minutes talking about the importance of smoking with some smoking cessation techniques and how to get help for quitting. She states that she used to drink but she has not for over 20 years. She lives with her husband. They are both retired. She has a daughter who is available to help her with her medical issues. She denies any IV drug abuse.   PAST SURGICAL HISTORY:  1. Hysterectomy.  2. Appendectomy.  3. Laser surgery on her eyes.  4. Stent placement in 2011.  5. Dental implants.   FAMILY HISTORY: Positive for stroke in her father, myocardial infarction in her mother, diabetes in her daughter, and colon cancer in her son.  CURRENT MEDICATIONS: 1. Symbicort 80 mcg/4.5 mcg 2 puffs twice daily. 2. Proventil every six hours.  3. Omeprazole 40 mg once daily. 4. Omega-3 multivitamins 1 p.o. once daily. 5. Hydrochlorothiazide 1 p.o. once daily. 6. Combivent 18 mcg every 6 hours. 7. Carvedilol 25 mg every 12  hours. 8. Aspirin 81 mg once daily. 9. Advair HFA inhaled. 10. Advair Diskus inhaled. 11. The patient used to be on Plavix, but [facility] and apparently she has been started on amiodarone 200 mg daily and in her home medications is says to continue Plavix but she said that she has not been taking it.  12. Citalopram 20 mg by mouth daily. 13. Lisinopril/hydrochlorothiazide has been continued despite her hyponatremia.  14. Aspirin 325 mg daily has been stopped.  REVIEW OF SYSTEMS: CONSTITUTIONAL: The patient denies any fever. She has been very fatigued. She has been very weak. She has pain on her chest and mid scapular area. She has significant weight gain due to edema. EYES: She denies any blurry vision, double vision, redness, or inflammation around her eyes. She denies any glaucoma. In the past, she has had laser surgery. ENT: Denies any tinnitus, ear pain, or hearing loss. Denies any difficulty swallowing or nasal drip. RESPIRATORY: Positive shortness of breath. Positive cough but no sputum. Denies any hemoptysis. She has exertional dyspnea. She cannot walk around more than 1 or 2  feet without getting short of breath. CARDIOVASCULAR: Denies any orthopnea prior to last night when she started getting very short of breath when she laid down. She has chest pain as described, on her lower sternal area and behind her clavicles. Positive edema. Denies any arrhythmias at the moment. She was diagnosed with atrial fibrillation during her acute hospitalization. That resolved and she is currently on amiodarone. GASTROINTESTINAL: Denies any abdominal pain, hematemesis, melena, rectal bleeding, or jaundice. GENITOURINARY: Denies any dysuria or hematuria. She does have a Foley catheter now. ENDOCRINE: Denies any polyuria, polydipsia, or polyphagia. No thyroid problems. No cold or heat intolerance. HEMATOLOGIC: she has lots of bruising on her belly due to Lovenox but denies any history of  easy bruising, bleeding, or swollen glands. SKIN: As mentioned above, she has some bruising from the Lovenox but no rashes or lesions. MUSCULOSKELETAL: Denies any significant lower back pain, edema, pain in her joints, gout, or redness. NEURO: Denies any numbness, weakness, or significant headaches at this moment. She was admitted previously for headache at [facility] and she was taking BC Powder but that seems to be resolved. No seizures and no memory loss. PSYCH: Denies any insomnia, bipolar disorder, or nervousness.   PHYSICAL EXAMINATION:   VITAL SIGNS: Blood pressure is 192/95, pulse 74, respiratory rate 20, and oxygen saturation is finally 100% on BiPAP.   GENERAL: The patient is alert. She is oriented x3. She is on BiPAP. She looks very agitated. She is using accessory respiratory muscles. She has moderate respiratory distress.   HEENT: Her pupils are equal and reactive. Extraocular movements are intact. Mucosa is moist. There is no oropharyngeal exudate. Anicteric sclerae. She is normocephalic and atraumatic.   NECK: Supple. No JVD. No masses. No adenopathy. Normal range of motion.   CARDIOVASCULAR: Distant heart tones. There is a slight systolic ejection murmur. Regular rate and rhythm, S1 and S2.   LUNGS: Very diminished respiratory sounds in both bases and she has crackles all over the mid fields in both lungs. There is no wheezing or rales or rhonchi.   ABDOMEN: Soft, nontender, and distended. No hepatosplenomegaly. No masses. Bowel sounds are positive.   GENITOURINARY: She has significant intertrigo. The patient has a Foley catheter.   EXTREMITIES: Positive edema +3 in all four extremities. Pulses are +1. No cyanosis and no clubbing.   SKIN: Multiple lesions, mostly ecchymosis on abdomen, which are secondary to low molecular weight heparin injections. No rashes other than the rash in the groin which is described as red, itchy, and nonraised, mostly intertrigo.   NEURO: Cranial nerves  II through XII grossly intact. Strength seems to be equal in all four extremities, 4 out of 5. Sensation seems to be equal in all four extremities as well. No focal exam.   PSYCH: Mood is difficult to assess due to her respiratory distress. The patient is a little bit agitated.   LYMPHATICS: Negative lymphadenopathy in the neck, supraclavicular, epitrochlear, and axillary regions.   MUSCULOSKELETAL: Negative for significant joint abnormalities.   RESULTS: BNP is 2917. Glucose is 121, creatinine 0.55, sodium 134, potassium 3.9, and albumin 3.2. She has mildly elevated AST and ALT at 47 and 84, respectively. Troponin is mildly elevated also at 0.27. White blood count is 13.6, hemoglobin 8.5, hematocrit 26, and platelets are 381.   pH is 7.29, pCO2 64, and HCO3 30. This has been done when the patient was on BiPAP and PO2 was around 200.   ASSESSMENT AND PLAN:  1. Acute respiratory failure.  The patient is requiring high flow oxygen and BiPAP at this moment. She seems still agitated with use of her accessory respiratory muscles and finally her oxygen saturations are getting more balanced. At this moment, she is at high risk of getting intubated, but the patient wanted to give a trial of BiPAP. For now we are going to continue with that plan, transferred to the Critical Care Unit and watch her. If the patient becomes tired due to her significant respiratory distress, we are going to intubate her. At this moment, the reason of her respiratory failure is pulmonary edema and we are going to treat it.  2. Congestive heart failure, diastolic dysfunction, ejection fraction of 55%, acute exacerbation. The patient is fluid overloaded. Lasix is going to be ordered, 40 mg IV every 8 hours, and follow urinary output. Potassium replacement is going to be given.  3. Elevated troponin. The patient is having a little bit of chest pain and some increase of her work load due to her congestive heart failure. We cannot rule  out if this is an issue with her coronary artery disease, although it seems more likely it is strain and increased demand for what we are going to watch and have troponin checks every eight hours.  4. Chest pain and shortness of breath and drop in 02 saturation. We are going to send the patient to get a stat CT scan of the chest with IV contrast to rule out the possibility of pulmonary embolism. We are going to continue Lovenox at prophylactic dose for now, but if it comes positive we are going to fully anticoagulate. 5. History of severe hyponatremia. At this moment actually it has been improved. We are going to follow up on her electrolytes.  6. Coronary artery disease. The patient is on Plavix. We are going to restart this medication since the patient has not been taking it at home since her discharge yesterday from [facility].  7. Hypertension. Continue Coreg. We are going to hold on her lisinopril/hydrochlorothiazide due to the fact the patient has had severe hyponatremia in the past.  8. Depression. Continue Citalopram.  9. Anemia. The patient does not have any significant history of anemia in the past. She has been transfused at [facility]. At this moment, we are going to give her 1 unit of blood to improve her oxygenation and follow-up on B12 levels and iron levels and Hemoccults.  10. Deep vein thrombosis prophylaxis with Lovenox.  11. GI prophylaxis with PPI.  The patient is a FULL CODE and she is going to be admitted to the Critical Care Unit for critical illness.   TIME SPENT: Approximately 55 minutes.  ____________________________ Felipa Furnace, MD rsg:slb D: 05/17/2012 12:20:40 ET     T: 05/17/2012 12:55:02 ET        JOB#: [id number] cc: Felipa Furnace, MD, <Dictator> Myrle Sheng. Katrinka Blazing, MD Regan Rakers Juanda Chance MD ELECTRONICALLY SIGNED 05/19/2012 13:41

## 2015-01-14 NOTE — Consult Note (Signed)
PATIENT NAME:  [name], [name] MR#:  [id number] DATE OF BIRTH:  02-22-1935  DATE OF CONSULTATION:  07/28/2012  REFERRING PHYSICIAN:  Dr. Elpidio AnisSudini  CONSULTING PHYSICIAN:  Emmerson Taddei Lizabeth LeydenN. Garhett Bernhard, MD  REASON FOR CONSULTATION: Hyponatremia.   HISTORY OF PRESENT ILLNESS: The patient is a 80 year old Caucasian female with past medical history of hypertension, diastolic heart failure, chronic obstructive pulmonary disease, prior episodes of hyponatremia, prior salicylate poisoning, chronic atrial fibrillation who presented yesterday to [facility] with altered mental status. The patient is a poor historian and has some issues with short-term memory, therefore, history provided was not very accurate. I called the patient'Carter daughter and obtained history. Over the past several days, the patient'Carter daughter has noted confusion in her mother. In addition to this, the patient was reported to have numerous episodes of vomiting over the past three days. However, when I asked the patient about this, she denied vomiting. The patient has had recent issues with salicylate toxicity. We are asked to see her for hyponatremia. Upon presentation, the patient'Carter serum sodium was 119. It is currently up to 123 with IV fluid hydration. She has had low normal sodiums in the past; however, on 05/22/2012 sodium was normal at 136. The patient'Carter urine drug screen was found to be negative. She is currently awake and alert, but remains confused. The patient'Carter urinalysis was unremarkable. She is noted to be on Spironolactone which I discussed with Dr. Cherlynn KaiserSainani and we will discontinue this shortly.   PAST MEDICAL HISTORY:  1. Hypertension.  2. Diastolic heart failure.  3. Chronic obstructive pulmonary disease.  4. Prior episodes of hyponatremia.  5. Prior salicylate poisoning.  6. History of atrial fibrillation.   ALLERGIES: Augmentin, Claritin, doxycycline, fentanyl, Flagyl, hydrochlorothiazide, Lodine, midazolam,  Neurontin, prednisone, Tylenol, Ultracet and Zyrtec.   SOCIAL HISTORY: The patient lives at home with her daughter and husband. Prior to admission she was ambulating on her own, but she has she has had an unsteady gait recently. The patient smokes cigarettes. She states that a pack of cigarettes would last her 2 to 3 days. No reported alcohol or illicit drug use.   FAMILY HISTORY: Significant for the patient'Carter mother who died secondary to complications of heart disease and a father who died from complications of prostate cancer.   REVIEW OF SYSTEMS: Unable to obtain accurate review of systems from the patient as she still has some periods of confusion and review of systems that was obtained was not consistent per the patient report.   PHYSICAL EXAMINATION:  VITAL SIGNS: Temperature 98.3, pulse 78, respirations 20, blood pressure 141/72, pulse ox 87%.   GENERAL: Well-developed, well-nourished Caucasian female who appears her stated age, currently in no acute distress.   HEENT: Normocephalic, atraumatic. Extraocular movements are intact. Pupils equal, round, and reactive to light. No scleral icterus. Conjunctivae are pink. No epistaxis noted. Gross hearing intact. Oral mucosa are dry.   NECK: Supple without JVD or lymphadenopathy.   LUNGS: Lungs demonstrate rhonchi bilaterally. The patient has normal respiratory effort.   HEART: S1, S2, regular rate and rhythm. No murmurs, rubs, or gallops appreciated.   ABDOMEN: Soft, nontender, nondistended. Bowel sounds positive. No rebound or guarding. No gross organomegaly appreciated.   EXTREMITIES: No clubbing, cyanosis, or edema.   NEUROLOGIC: The patient is awake and alert. She is oriented to self. She is also oriented to place. She is off on the date at this point in time.   SKIN: Warm and dry. No rashes noted.  MUSCULOSKELETAL: No joint redness, swelling or tenderness appreciated.   GU: No suprapubic tenderness is noted at this time.    PSYCHIATRIC: The patient is with appropriate affect but has limited insight into her current illness. Her description of the history had inconsistencies as she initially stated she had no nausea and vomiting, but later said that she was having some nausea.   LABORATORY DATA: Presenting serum sodium was 119. BMP this morning shows sodium 123, potassium 4.2, chloride 91, CO2 23, BUN 11, creatinine 0.81, glucose 84, calcium 8.6. Ammonia level was less than 25. LFTs shows total protein 7, albumin 3.1, total bilirubin 0.2, alkaline phosphatase 78, AST 28, ALT 20, troponin less than 0.02. Urine drug screen was negative. CBC shows WBC 16.2, hemoglobin 9.6, hematocrit 28.5, platelets 285. Blood cultures x2 sets are negative. Urine culture is negative. Urinalysis shows specific gravity 1.003, pH 6, serum salicylates are slightly high at 20.4.   IMPRESSION: This is a 80 year old Caucasian female with past medical history of hypertension, diastolic heart failure, chronic obstructive pulmonary disease, and prior episodes of hyponatremia, history of salicylate poisoning, history of atrial fibrillation, who presented to [facility] with confusion and found to have hyponatremia in the setting of numerous episodes of nausea and vomiting at home, per the patient'Carter daughter.   PROBLEM LIST:  1. Acute hyponatremia.  2. Nausea and vomiting.  3. History of recent salicylate toxicity.  4. Chronic obstructive pulmonary disease.   PLAN: The patient has had prior episodes of hyponatremia. Per the history, the patient apparently has been having nausea and vomiting at home numerous episodes a day over the past three days prior to admission. Oral mucosa was found to be dry. Therefore, it is suspected that she has hypovolemic hyponatremia at this time. In addition, the patient has responded to therapy with 0.9 normal saline and her serum sodium is up to 124. We will continue low rate normal saline at 50 mL/h  for now. We will monitor serum sodium every six hours for now. Goal serum sodium for today is 129. Hopefully, her confusion should improve with correction of the serum sodium. We will also check TSH, cortisol, SPEP, and UPEP. In addition, the patient does have underlying lung disease. There is potential for underlying SIADH as well. Therefore, we will check serum uric acid which can sometimes help to delineate between hypovolemic    hyponatremia and SIADH. Further treatment plan per the hospitalist. I would like to thank Dr. Elpidio Anis for the kind referral.     ____________________________ Lennox Pippins, MD mnl:ap D: 07/28/2012 11:28:44 ET T: 07/28/2012 11:53:19 ET JOB#: [id number]  cc: Lennox Pippins, MD, <Dictator> Ria Comment Shion Bluestein MD ELECTRONICALLY SIGNED 08/08/2012 10:46

## 2015-01-14 NOTE — H&P (Signed)
PATIENT NAME:  [name], [name] MR#:  [id number] DATE OF BIRTH:  March 10, 1935  DATE OF ADMISSION:  06/12/2012  PRIMARY CARE PHYSICIAN: Kristen SimmerKristi Smith, MD   CHIEF COMPLAINT: Altered mental status, nausea and vomiting.   HISTORY OF PRESENT ILLNESS: This is a 80 year old female who presents to the Emergency Room due to confusion, nausea, and vomiting. The patient herself is a poor historian, therefore, most of the history is obtained from the daughter at bedside. The patient was just recently discharged from the hospital last month and now returns back due to nausea, vomiting, and confusion. As per the daughter, the patient has been more and more confused where repeatedly asking for the same things and not talking like herself. She has a good long-term memory but her short-term memory has been getting significantly worse over the past couple of days. She also started having recurrent nausea and vomiting over the past two days. As per the daughter, the patient chronically complains of headaches and is addicted to [employer] and takes many [employer] every single day. She apparently has taken about eight packets today and probably about 20+ over the past few days. She came to the ER and was noted to have a salicylate level as high as 44.5. She was noted to be mildly acidotic and also noted to have a low bicarb. Hospitalist services were contacted for admission.   The patient denies any abdominal pain. Does admit to nausea and vomiting. Admits to a chronic headache but no chest pain, no worsening shortness of breath, and no other associated symptoms presently.   REVIEW OF SYSTEMS: CONSTITUTIONAL: No documented fever. No weight gain, no weight loss. EYES: No blurry or double vision. ENT: No tinnitus, no postnasal drip, no redness of the oropharynx. RESPIRATORY: Positive cough, chronic. Positive COPD. No hemoptysis. Positive chronic dyspnea. CARDIOVASCULAR: No chest pain, no orthopnea, no palpitations, no  syncope. GI: Positive nausea. Positive vomiting. No diarrhea, no abdominal pain, no melena, no hematochezia. GU: No dysuria, no hematuria. ENDOCRINE: No polyuria or nocturia. No heat or cold intolerance. HEME: No anemia, no bruising, no bleeding. INTEGUMENTARY: No rashes, no lesions. MUSCULOSKELETAL: No arthritis, no swelling, no gout. NEUROLOGIC: No numbness, no tingling, no ataxia, no seizure-type activity. PSYCH: No anxiety, no insomnia, no ADD.   PAST MEDICAL HISTORY:  1. Hypertension.  2. Chronic obstructive pulmonary disease.  3. History of diastolic congestive heart failure. 4. History of chronic hyponatremia.  5. History of previous salicylate poisoning. 6. Chronic atrial fibrillation.   ALLERGIES: Augmentin, Claritin, doxycycline, Flagyl, hydrochlorothiazide, Lodine, Neurontin, prednisone, Tylenol, Ultracet, and Zyrtec.   SOCIAL HISTORY: Still smokes about a half a pack daily. Has a long 30 to 40 pack year smoking history. No alcohol abuse. No illicit drug abuse. Lives at home with her husband and her daughter.   FAMILY HISTORY: The patient'Carter father died from prostate cancer. Mother died from complications of heart disease.   CURRENT MEDICATIONS:  1. Lisinopril 10 mg daily.  2. Multivitamin daily.  3. Omega-3 fatty acids 1 tab daily.  4. Spironolactone 25 mg daily.  5. Combivent 2 puffs q.6 hours as needed.  6. Aspirin 81 mg daily.  7. Amiodarone 200 mg 2 tabs daily.  8. Advair 115/21 one puff b.i.d.   PHYSICAL EXAMINATION ON ADMISSION:   VITAL SIGNS: Temperature 99.2, pulse 82, respirations 22, blood pressure 152/63, sats 97% on room air.   GENERAL: She is a pleasant appearing female but in mild distress.   HEENT: Atraumatic, normocephalic. Her  extraocular muscles are intact. The pupils are equal and reactive to light. Sclerae anicteric. No conjunctival injection. No pharyngeal erythema.   NECK: Supple. No jugular venous distention, no bruits, no lymphadenopathy, no  thyromegaly.   HEART: Regular rate and rhythm, tachycardic. No murmurs, no rubs, no clicks.   LUNGS: She has some coarse rhonchi and wheezing diffusely but negative use of accessory muscles. No dullness to percussion.   ABDOMEN: Soft, flat, nontender, nondistended. Has good bowel sounds. No hepatosplenomegaly appreciated.   EXTREMITIES: No evidence of any cyanosis, clubbing, or peripheral edema. Has +2 pedal and radial pulses bilaterally.   NEUROLOGICAL: The patient is alert, awake, and oriented x2. No focal motor or sensory deficits bilaterally.   SKIN: Moist and warm with no rash appreciated.   LYMPHATIC: There is no cervical or axillary lymphadenopathy.   LABORATORY, DIAGNOSTIC, AND RADIOLOGICAL DATA: Serum glucose 87, BUN 11, creatinine 0.9, sodium 128, potassium 5.3, chloride 100, bicarb 19. LFTs are within normal limits. CK 83. Troponin 0.03. White cell count 8.7, hemoglobin 10.2, hematocrit 30.1, platelet count 374. Urinalysis is within normal limits. Salicylate level is at 44.5. Venous blood gas showed a pH of 7.34, pO2 of 43, pCO2 39.   ASSESSMENT AND PLAN: This is a 80 year old female with a history of diastolic congestive heart failure, chronic atrial fibrillation, hypertension, history of previous salicylate poisoning, gastroesophageal reflux disease, COPD, and tobacco abuse who presents to the hospital with altered mental status, nausea and vomiting and noted to have salicylate poisoning.  1. Altered mental status/confusion. This is likely related to her salicylate poisoning with concomitant underlying dementia. No evidence of any acute infectious or metabolic source and unlikely a neurologic source given her clinical symptoms. Will follow her mental status as her salicylate level comes down.  2. Salicylate poisoning. This is an accidental overdose. This patient is addicted to [employer] and takes them increasingly for her migraine headaches. She apparently took 8 of them today  and has taken 20+ over the past few days. Her salicylate level currently is 44.5. I did call Poison Control and discussed with them the plan of care and they recommended alkalizing her urine, therefore, I will start her on D5W with 3 amps of bicarb, follow salicylate levels, and urinary pH every three hours until the levels become less than 34. Will also check Tylenol level as they recommended that. The patient'Carter BUN and creatinine are currently stable. Her potassium is normal. She has normal LFTs. She complains of no tinnitus. Will follow her clinically and follow her levels as mentioned.  3. History of chronic atrial fibrillation. The patient is rate controlled on her amiodarone which I will resume. She has chronic anemia and is not a candidate for long-term anticoagulation. Will hold aspirin for now given her nausea, vomiting, and salicylate poisoning.  4. Chronic obstructive pulmonary disease. No evidence of acute exacerbation. Continue Advair and p.r.n. nebulizer treatments.  5. Gastroesophageal reflux disease. Continue IV Protonix.  6. Tobacco abuse. Placed on nicotine patch.  7. Hypertension. The patient is on lisinopril and Aldactone which I will hold given her hyperkalemia.   8. History of congestive heart failure secondary to diastolic dysfunction. I will follow her clinically and closely as she is going to get aggressive IV fluids with bicarb and watch for any pulmonary edema or shortness of breath. Hold her diuretics for now.  9. History of chronic hyponatremia. Sodium is currently at 128. We will follow this closely, unlikely this is the source of her altered  mental status.   CODE STATUS: The patient is a FULL CODE.   TIME SPENT WITH THE ADMISSION: 50 minutes.   ____________________________ Rolly Pancake. Cherlynn Kaiser, MD vjs:drc D: 06/12/2012 19:23:41 ET T: 06/13/2012 06:13:15 ET JOB#: [id number]  cc: Rolly Pancake. Cherlynn Kaiser, MD, <Dictator> Myrle Sheng. Katrinka Blazing, MD Houston Siren MD ELECTRONICALLY SIGNED  06/23/2012 12:15

## 2015-01-17 NOTE — H&P (Signed)
PATIENT NAME:  [name], [name] MR#:  [id number] DATE OF BIRTH:  [birth year]  DATE OF ADMISSION:  12/24/2012  PRIMARY CARE PHYSICIAN[facility]   CHIEF COMPLAINT: Cough, congestion and exertional shortness of breath.   HISTORY OF PRESENT ILLNESS: This is a 80 year old female who presents to the hospital with shortness of breath, cough, congestion and exertional shortness of breath for the past 2 days.  The patient originally began her symptoms about 10 days ago with some productive sputum, cough and congestion. She was seen by her primary care physician, put on a prednisone taper and a round of antibiotics. She was not improving so, therefore, went back to see her primary care physician a few days ago, who restarted back on a prednisone taper which she finished today along with some Levaquin. Today, she had worsening exertional shortness of breath and still continues to feel increasingly weak and, therefore, came to the ER. The patient was noted to be in COPD exacerbation. Hospitalist services were contacted for further treatment and evaluation. The patient does admit to a cough which is productive with yellow sputum. No fevers, no chills, no chest pain, no nausea, no vomiting, no abdominal pain. She did have some occasional diarrhea a few days ago, which has now resolved.   REVIEW OF SYSTEMS:   CONSTITUTIONAL: No documented fever. No weight gain, no weight loss.  EYES: No blurred or double vision.  ENT: No tinnitus. No postnasal drip. No redness of the oropharynx.  RESPIRATORY: Positive cough. Positive wheeze. No hemoptysis. Positive dyspnea on exertion. Positive COPD.  CARDIOVASCULAR: No chest pain, no orthopnea, no palpitations, no syncope.  GASTROINTESTINAL: No nausea, no vomiting. Positive diarrhea. No abdominal pain, no melena, no hematochezia.  GENITOURINARY: No dysuria, no hematuria.  ENDOCRINE: No polyuria or nocturia. No heat or cold intolerance.  HEMATOLOGIC:  No anemia, no  bruising, no bleeding.  INTEGUMENTARY: No rashes. No lesions.  MUSCULOSKELETAL: No arthritis, no swelling, no gout.  NEUROLOGIC: No numbness or tingling. No ataxia. No seizure-type activity.  PSYCHIATRIC: No anxiety, no insomnia, no ADD.  PAST MEDICAL HISTORY: Consistent with: 1. COPD. 2. Chronic atrial fibrillation.  3. Hypertension.  4. History of diastolic congestive heart failure.  5. Dementia.   ALLERGIES: Multiple drugs, including Augmentin, Claritin, doxycycline, fentanyl, Flagyl, hydrochlorothiazide, midazolam, Neurontin, prednisone, Tylenol, Ultracet, Zyrtec. The exact reaction to most of these medications is unknown. Therefore, it seems unlikely they are allergies but more likely side effects.   SOCIAL HISTORY: She still smokes about a pack per day, does have about a 40 to 50 pack-year smoking history. No alcohol abuse. No illicit drug abuse. She lives at home with her husband and her daughter.   FAMILY HISTORY: Both mother and father died from complications of old age.   CURRENT MEDICATIONS: Advair 250/50, 1 puff b.i.d., Aricept 5 mg daily, amiodarone 400 mg daily, amlodipine 5 mg daily, aspirin 81 mg daily, Tessalon Perles b.i.d., Combivent 2 puffs q. 6 hours as needed, hydralazine 25 mg q.i.d., Levaquin 500 mg daily, Coreg 25 mg b.i.d., Flexeril 10 mg b.i.d., Aldactone 25 mg b.i.d., Spiriva 1 puff daily and omega-3 fatty acid supplement daily.   PHYSICAL EXAMINATION:  VITAL SIGNS: On admission, temperature is 98.4, pulse 78, respirations 22, blood pressure 119/61, sats 95% on 2 liters nasal cannula.  GENERAL: She is a pleasant-appearing female in mild respiratory distress.  HEENT: She is atraumatic, normocephalic. Extraocular muscles are intact. Pupils are equal and reactive to light. Sclerae are anicteric. No conjunctival  injection. No pharyngeal erythema.  NECK: Supple. There is no jugular venous distention, no bruits, no lymphadenopathy or thyromegaly.  HEART: Regular rate  and rhythm. No murmurs, no rubs no clicks.  LUNGS: She has prolonged inspiratory and expiratory phase, diffuse end expiratory wheezing, positive use of accessory muscles, no dullness to percussion.  ABDOMEN:  Soft, flat, nontender, nondistended. Has good bowel sounds. No hepatosplenomegaly appreciated.   EXTREMITIES: No evidence of any cyanosis, clubbing, or peripheral edema. Has +2 pedal and radial pulses bilaterally.  NEUROLOGIC: The patient is alert, awake and oriented x 3 with no focal motor or sensory deficits appreciated bilaterally.  SKIN: Moist and warm with no rash appreciated.  LYMPHATIC: There is no cervical or axillary lymphadenopathy.   LABORATORY, DIAGNOSTIC AND RADIOLOGICAL DATA:  Serum glucose of 187, BUN 20, creatinine 1.2, sodium 133, potassium 4.4, chloride 100, bicarbonate 26, AST 43, ALT 81, albumin 3.2. Troponin 0.03. White cell count 13.4, hemoglobin 11.0, hematocrit 33.0, platelet count  59. ABG showed a pH of 7.35, pCO2 of 45, pO2 of 59.   ASSESSMENT AND PLAN: This is a 80 year old female with a history of chronic atrial fibrillation, history of COPD with ongoing tobacco abuse, hypertension, history of diastolic CHF, chronic hyponatremia, previous history of salicylate poisoning, presents to the hospital with cough, congestion, exertional shortness of breath and noted to be in COPD.   1. Chronic obstructive pulmonary disease exacerbation:  This is likely secondary to her ongoing tobacco abuse complicated with possible underlying bronchitis. The patient has failed outpatient therapy with p.o. steroids and antibiotics; therefore, I will admit the patient, start her on IV steroids, IV Levaquin and continue her Advair, Spiriva and DuoNebs q. 6 hours. We will follow sputum cultures. She will also need to be assessed for home oxygen prior to discharge.  2. Chronic atrial fibrillation: The patient is currently rate-controlled.  I will continue her amiodarone, continue Coreg, continue  aspirin for now as she is a high fall risk, therefore, not on long-term anticoagulation.  3. History of diastolic congestive heart failure:  Clinically not in congestive heart failure. BNP is only in the 700s. I will continue her Coreg, hydralazine and Aldactone for now.  4. Hypertension: Continue Norvasc, continue Coreg and continue hydralazine.  5. Dementia: Continue Aricept.   CODE STATUS: The patient is a FULL CODE.         TIME SPENT: 50 minutes.  ____________________________ Rolly Pancake. Cherlynn Kaiser, MD vjs:cb D: 12/24/2012 21:15:21 ET T: 12/24/2012 21:49:05 ET JOB#: [id number]  cc: Rolly Pancake. Cherlynn Kaiser, MD, <Dictator> [place] Siren MD ELECTRONICALLY SIGNED 01/02/2013 13:36

## 2015-01-17 NOTE — H&P (Signed)
PATIENT NAME:  [name], [name] MR#:  [id number] DATE OF BIRTH:  August 05, 1935  DATE OF ADMISSION:  07/12/2013  PRIMARY CARE PHYSICIAN: Nonlocal.   REFERRING PHYSICIAN: Wille CelesteKathryn R. Andrukonis, PA-C   CHIEF COMPLAINT: Confusion, blurry vision, pedal edema, shortness of breath.  HISTORY OF PRESENT ILLNESS: The patient is a 80 year old Caucasian female with a past medical history of end-stage COPD, congestive heart failure, still smoking, was just recently admitted to the hospital on October 9th and was discharged on the 13th with acute exacerbation of COPD and CHF, who is presenting to the ER with sudden onset of shortness of breath and chest tightness. The patient is also complaining of significant swelling in her feet and wheezing. The patient was hypoxemic, and she was placed on 3 liters of oxygen via nasal cannula. The patient was confused at home, and at that time, she was complaining of some vision changes as well. Her pulse oximetry was 84% on room air, as reported by the EMS. The patient is brought into the ER. Chest x-ray has revealed left-sided pleural effusion. BNP is elevated at 1500. During my examination, the patient denies any chest pain, but still complaining of shortness of breath and reporting that she is swollen all over her body. Husband is at bedside. The patient was given 1 dose of Lasix, but the family is concerned about renal insufficiency. First set of cardiac enzymes is negative. CT head in the ER was also negative for acute findings.   PAST MEDICAL HISTORY:  1. Hypertension.  2. Severe COPD, but still continues to smoke.  3. History of diastolic congestive heart failure.  4. Paroxysmal atrial fibrillation, not on any anticoagulation.  5. Mild dementia.   PAST SURGICAL HISTORY: No history of operations.  ALLERGIES: SHE IS ALLERGIC TO AUGMENTIN, CLARITIN, DOXYCYCLINE, FENTANYL, FLAGYL, HYDROCHLOROTHIAZIDE, MIDAZOLAM, NEURONTIN, PREDNISONE, TYLENOL, ULTRACET, ZYRTEC. THE PATIENT  IS ALLERGIC TO PREDNISONE, BUT ACCORDING TO THE HUSBAND, SHE DOES TAKE 20 MG OF PREDNISONE EVERY DAY.   PSYCHOSOCIAL HISTORY: Lives at home with husband. Still smokes half-pack a day. Denies alcohol or illicit drug usage.   FAMILY HISTORY: Mother and father died of complications of old age.   HOME MEDICATIONS:  1. Vitamin D3 one capsule p.o. once daily.  2. Prednisone tapering dose on 60 mg, was just started yesterday.  3. Potassium chloride 20 mEq p.o. 3 times a day. 4. Mirtazapine 15 mg once daily. 5. Lisinopril 10 mg once daily. 6. Levofloxacin 250 mg p.o. q.24 hours. 7. Lasix 80 mg 1-1/2 tablets once daily.  8. Hydralazine 25 mg q.6 hours. 9. Donepezil 5 mg once daily.  10. Cyclobenzaprine 10 mg 3 times a day. 11. Coreg 25 mg 2 times a day.  12. Amlodipine 5 mg once daily. 13. Amiodarone 200 mg 2 tablets once daily. 14. Advair 1 puff inhalation 4 times a day.   REVIEW OF SYSTEMS:  CONSTITUTIONAL: Denies any fever, fatigue or weakness.  EYES: Complaining of blurry vision. Denies inflammation.   EARS, NOSE, THROAT: No epistaxis, tinnitus, discharge.  RESPIRATION: Complaining of shortness of breath, getting worse progressively, associated with coughing. Has history of severe COPD and congestive heart failure.  CARDIOVASCULAR: No chest pain. Does have orthopnea and positive pedal edema.  GASTROINTESTINAL: Denies nausea, vomiting, diarrhea, abdominal pain. ENDOCRINE: Denies polyuria, nocturia, thyroid problems.  INTEGUMENTARY: No acne, rash, lesions.  MUSCULOSKELETAL: No joint pain, tenderness or erythema.  NEUROLOGIC: No vertigo or ataxia.  PSYCHIATRIC: No ADD or OCD.  PHYSICAL EXAMINATION:  VITAL SIGNS: Temperature  is not recorded, pulse 81, respirations 22, blood pressure 167/76, pulse oximetry 96% on 2 liters.  GENERAL APPEARANCE: Not under acute distress. Moderately built and nourished.  HEENT: Normocephalic, atraumatic. Pupils are equally reacting to light and  accommodation. Extraocular movements are intact. Nares are patent. No turbinate hypertrophy. No congestion of the tympanic membranes. No lip swelling. Moist mucous membranes.  NECK: Supple. No JVD. No thyromegaly.  LUNGS: Positive diffuse wheezing. Positive rales and rhonchi.  CARDIAC: S1, S2 normal. Regular rate and rhythm. No murmurs. Point of maximum impulse is nondisplaced. Significant peripheral edema is present bilaterally.  GASTROINTESTINAL: Soft. Bowel sounds are positive in all 4 quadrants. Nontender, nondistended. No hepatosplenomegaly.  NEUROLOGIC: Awake, alert, oriented x3. Following verbal commands appropriately. Cranial nerves II through XII are grossly intact. Reflexes are 2+. No cerebellar signs. No pronator drift.  SKIN: Normal. No rashes. No lesions.  MUSCULOSKELETAL: No joint effusion, tenderness, erythema  EXTREMITIES :No pedal edema, cyanosis and clubbing. PSYCHIATRIC: Normal mood and affect.   LABORATORY AND IMAGING STUDIES: BNP is elevated at 1582. Serum glucose of 244, BUN 53, creatinine 1.97. Her baseline creatinine is running at around 1.85 to 2.10 in the past few months. Sodium is 137, potassium 4.4, chloride 95, CO2 37, anion gap 5, GFR 24, serum osmolality 297, calcium 8.6. CK total 152, troponin less than 0.02, CPK-MB 0.9. WBC 11.5, hemoglobin 9.1, hematocrit 27.8, platelets 244. Urinalysis: Nitrites, leukocyte esterase are negative. Blood gas, ABG: pH 7.47, pCO2 55, pO2 103 on 32% FiO2, bicarbonate is 40, pulse oximetry is 98%. CAT scan of the head is negative.   ASSESSMENT AND PLAN: A 80 year old female presenting to the ER with a chief complaint of shortness of breath associated with hypoxia, wheezing, weight gain, pedal edema and transient episode of altered mental status with blurry vision. Will be admitted with the following assessment and plan.   1. Altered mental status with blurry vision and weakness. Rule out transient ischemic attack. Will get stroke workup  including MRI of the brain, carotid Dopplers, 2-D echocardiogram. Neuro checks will be obtained.  The patient will be on aspirin and statin.  2. Acute exacerbation of chronic obstructive pulmonary disease, still continues to smoke. Counseled to quit smoking. Will provide her IV Solu-Medrol, nebulizer treatments and change the p.o. to IV levofloxacin.  3. Acute exacerbation of congestive heart failure with significant pedal edema. Will provide her IV Lasix with close monitoring of renal function. Avoid nephrotoxins. Hold off on amlodipine as the patient has pedal edema.  Cycle cardiac biomarkers.  Obtain echocardiogram to evaluate left ventricular ejection fraction and valvular abnormalities.  4. Chronic kidney disease. The patient seems to be at baseline. Will monitor renal function closely as the patient is on Lasix.  5. Paroxysmal atrial fibrillation. Right now, the patient is not in rapid ventricular response. Will continue aspirin and resume her home medication.  6. Will provide her gastrointestinal and deep vein thrombosis prophylaxis.   Diagnosis and plan of care were discussed in detail with the patient and her husband at bedside. They verbalized understanding of the plan.   TOTAL TIME SPENT ON ADMISSION: 45 minutes.   ____________________________ Ramonita Lab, MD ag:lb D: 07/12/2013 07:50:00 ET T: 07/12/2013 08:21:27 ET JOB#: [id number]  cc: Ramonita Lab, MD, <Dictator> Primary Care Physician Ramonita Lab MD ELECTRONICALLY SIGNED 07/27/2013 1:45

## 2015-01-17 NOTE — Discharge Summary (Signed)
PATIENT NAME:  [name], [name] MR#:  [id number] DATE OF BIRTH:  [birth year]  DATE OF ADMISSION:  12/24/2012 DATE OF DISCHARGE:  12/27/2012  ADMITTING DIAGNOSIS: Cough and shortness of breath.   DISCHARGE DIAGNOSES:  1.  Cough and shortness of breath due to acute chronic obstructive pulmonary disease exacerbation, as well as acute bronchitis.  2.  Hyponatremia possibly due to spironolactone therapy. Her spironolactone is currently held. She will need outpatient followup with her primary care provider and repeat basic metabolic panel.  3.  Acute on chronic obstructive pulmonary disease exacerbation.  4.  Acute bronchitis.  5.  Chronic atrial fibrillation.  6.  Hypertension.  7.  History of diastolic congestive heart failure without any evidence of acute exacerbation.  8.  Dementia.   PERTINENT LABS/EVALUATIONS: Admitting EKG showed sinus rhythm with first degree AV block, possible left atrial enlargement. Chest x-ray showed no acute cardiopulmonary processes. Blood cultures x 2 no growth at 36 hours. Troponin was 0.03. Admitting glucose 187, BUN 20, creatinine 1.25, sodium 133, potassium 4.4, chloride 100, CO2 26. Subsequent LFTs: Bilirubin total 0.1, alkaline phosphatase 69, ALT was 81, AST was 43, total protein 6.8. BNP was 741. ABG pH of 7.35, pCO2 45, pO2 59. Most recent sodium on 04/01 was 127. Serum osmolality was 268, urine osmolality was 274.   HOSPITAL COURSE: Please refer to H and P done by the admitting physician. The patient is a 80 year old white female with history of COPD, who presented with complaint of dyspnea on exertion, shortness of breath and cough, productive. She was seen in the ED and noted to have likely COPD exacerbation. The patient was admitted and started on antibiotics, nebulizers and steroids. The patient started to improve day by day. She is doing much better and is very anxious to go home. She did qualify for home O2, which being arranged. She will need to follow up  with primary care physician to determine if this needs to be continued. She also had hyponatremia, possibly related to some mild dehydration and spironolactone therapy. Her spironolactone is currently held. She will follow up with her regular primary care provider and have a BMP check. At that time, needs to decide whether she needs to be restarted on spironolactone. At this time, she is stable for discharge. Discharge instructions for history of diastolic CHF given.  DISCHARGE MEDICATIONS: Omega-3 1 tab p.o. daily, Combivent 2 puffs q. 6 hours p.r.n., aspirin 81 mg 1 tab p.o. daily, hydralazine 25 mg 1 tab 4 times a day, Advair 250/50 mg 1 puff b.i.d.,  5 mg daily, amiodarone 400 mg daily, amlodipine 5 mg daily,   200 mg 1 tab p.o. b.i.d., carvedilol 25 mg 1 tab p.o. b.i.d.,  10 mg 1 tab p.o. b.i.d., Spiriva 18 mcg 1 cap daily, albuterol and Atrovent nebulizers q. 8 hours p.r.n. wheezing, guiafenesin 1 tab p.o. b.i.d., levofloxacin 250 mg 1 tab q. 24 hours for the next 4 days and prednisone taper starting 6 mg and taper by 10 mg until complete.  HOME OXYGEN: 2 L.   DIET: Low sodium.   ACTIVITY: As tolerated.   FOLLOWUP: With primary M.D. in 1 to 2 weeks. Check BMP at the time of the visit to primary MD. Hold spironolactone until seen by primary doctor.    TIME SPENT: 35 minutes spent.   ____________________________ Lacie Scotts. Allena Katz, MD shp:aw D: 12/28/2012 08:25:30 ET T: 12/28/2012 08:49:18 ET JOB#: [id number]  cc: Leara Rawl H. Allena Katz, MD, <Dictator> Charise Carwin MD ELECTRONICALLY  SIGNED 12/31/2012 19:34

## 2015-01-17 NOTE — Discharge Summary (Signed)
PATIENT NAME:  [name], [name] MR#:  [id number] DATE OF BIRTH:  08/03/1935  DATE OF ADMISSION:  07/05/2013  ADMITTING PHYSICIAN:  Dr. Luberta MutterKonidena  DATE OF DISCHARGE:  07/09/2013  DISCHARGING PHYSICIAN:  Dr. Enid Baasadhika Keari Miu  [facility]   CONSULTATIONS IN THE HOSPITAL:  None.   DISCHARGE DIAGNOSES: 1.  Acute on chronic respiratory failure. 2.  COPD exacerbation.  3.  Acute on chronic diastolic CHF exacerbation.  4.  Ongoing smoking.  5.  Hypertension.  6.  Acute renal failure.  7.  Paroxysmal atrial fibrillation.  8.  Mild dementia.  9.  Depression.   DISCHARGE HOME MEDICATIONS:  1.  Advair 250/50, 1 puff b.i.d.  2.  Flexeril 10 mg p.o. b.i.d.  3.  DuoNeb 3 mL q. 6 hours.  4.  Vitamin D 50,000  international units once a week.  5.  Combivent Respimat 1 puff 4 times a day.  6.  Prednisone taper.  7.  Lisinopril 10 mg p.o. daily.  8.  Amiodarone 400 mg p.o. daily.  9.  Remeron 15 mg p.o. at bedtime.  10.  Coreg of 25 mg p.o. b.i.d.  11.  Norvasc 5 mg p.o. daily.  12.  Donepezil 5 mg p.o. daily.  13.  Hydralazine 25 mg p.o. 4 times a day.  14.  Levaquin 250 mg daily for 4 days.  15.  Azelastine nasal spray twice a day.  16.  Potassium chloride 20 mEq p.o. 3 times a day.  17.  Lasix 120 mg in the morning.  18.  Lasix 80 mg at bedtime.  19.  Lidoderm patch apply to the back for 12 hours and take off for 12 hours daily.  20.  Oxygen 2 liters at home.    DISCHARGE DIET:  Low-sodium diet.   DISCHARGE ACTIVITY:  As tolerated.   FOLLOWUP INSTRUCTIONS:  Resume hospice services at home. PCP followup in 1 to 2 weeks.   LABS AND IMAGING STUDIES: Sodium 139, potassium 3.5, chloride 98, bicarb 32, BUN 47, creatinine 1.85, glucose 230, calcium of 8.53. WBC 7.0, hemoglobin 9.0, hematocrit 25.8, platelet count 191. Cardiac enzymes remain negative. Chest x-ray revealing clear lung fields and some cardiomegaly.   BRIEF HOSPITAL COURSE: [name] is a 80 year old,  obese Caucasian female, with a known history of chronic respiratory failure secondary to COPD and diastolic CHF, on 2 to 3 liters home oxygen. Presents to the hospital secondary to worsening pedal edema and worsening shortness of breath. The patient has end-stage COPD, and has already been followed by hospice at home, but because her peripheral edema and breathing was getting worse, her husband got worried and got her to the hospital.   Acute on chronic respiratory failure secondary to COPD exacerbation and CHF exacerbation. The patient was just seen by her physician as an outpatient a couple of days prior to admission, and her Lasix was increased from 80 mg twice a day to 3 times a day. However, that was not helping, so she presented to the hospital. She was also noted to be in mild renal failure, likely secondary to overdiuresis, and Lasix was decreased in the hospital. She was started on steroids and oxygen support. Duo Neb inhalers were continued.   Her breathing started to improve. She started to have a productive cough with sputum, so was started also on antibiotics for possible bronchitis. The patient does continue to smoke, and this could be adding on her problem, and she was counseled at the time  of discharge. She is being discharged on prednisone taper, Lasix, inhalers, and hospice services will be resumed. She did work with Physical Therapy, and walked using a walker while in the hospital, but gets easily dyspneic on minimal exertion, which is her baseline.   Her course has been otherwise uneventful in the hospital.   DISCHARGE CONDITION:  Stable.   DISCHARGE DISPOSITION:  Home with hospice.  Time spent on discharge is 45 minutes.    ____________________________ Enid Baas, MD rk:mr D: 07/11/2013 15:24:00 ET T: 07/11/2013 19:21:00 ET JOB#: [id number]  cc: Enid Baas, MD, <Dictator> Enid Baas MD ELECTRONICALLY SIGNED 07/14/2013 12:04

## 2015-01-17 NOTE — Discharge Summary (Signed)
PATIENT NAME:  [name], [name] MR#:  [id number] DATE OF BIRTH:  1935/01/25  DATE OF ADMISSION:  07/12/2013 DATE OF DISCHARGE:  07/14/2013  CHIEF COMPLAINT: Shortness of breath.   DISCHARGE DIAGNOSES:  1.  Altered mental status/confusion, likely secondary to dementia with sundowning.  2.  Mild acute on chronic obstructive pulmonary disease exacerbation.  3.  Hypertension.  4.  Hyperlipidemia.  5.  Dementia.  6.  Ongoing tobacco abuse.  7.  Hypokalemia.  8.  Chronic respiratory failure, home with hospice.  9.  History of chronic diastolic congestive heart failure.  10. Paroxysmal atrial fibrillation.   DISCHARGE MEDICATIONS: Advair 250/50 mcg 1 puff 2 times a day, cyclobenzaprine 10 mg 2 times a day, vitamin D3 50,000 units once a week, lisinopril 10 mg daily, amiodarone 200 mg 2 caps once daily, mirtazapine 15 mg at bedtime, carvedilol 25 mg 2 times a day, albuterol/ipratropium CFC free 1 puff 4 times a day, amlodipine 5 mg daily, donepezil 5 mg daily, hydralazine 25 mg 4 times a day, Azelastine nasal 1 spray 2 times a day, potassium chloride 20 mEq 3 times a day, Lasix 80 mg once a day at bedtime and 120 mg in the morning, Lidoderm patch apply to affected area once a day, albuterol/ipratropium nebulizer 1 vial every 4 hours, Spiriva 18 mcg daily and levofloxacin 250 mg 1 tab daily for 3 days. She will be going home with oxygen 2 L continuous.   DIET: Low sodium.   ACTIVITY: As tolerated.   FOLLOWUP: Please follow with PCP and stop smoking.   DISPOSITION: Home with hospice.   HISTORY OF PRESENT ILLNESS AND HOSPITAL COURSE: For full details of H and P, please see the dictation on 10/16 by Dr. Amado CoeGouru, but briefly this is a 80 year old female with end-stage COPD, congestive heart failure and ongoing tobacco abuse with recent admission, who was discharged for the COPD and CHF exacerbation. She came into the ER being short of breath. Her oxygen was continued. She did have some confusion at home.  Pulse oximetry was 84% on room air. However, she is a chronic respiratory failure patient, who is supposed to be on oxygen. She was admitted to the hospitalist service. CT of the head was negative in the ER for acute stroke. She had further testing done, which ruled out stroke completely. She had a MRI of the brain, which was negative for stroke and her symptoms and improved. Her altered mental status was likely secondary to her dementia and sundowning. Palliative care was consulted and the patient was seen by Dr. Harvie JuniorPhifer. She did have mild COPD flare with wheezing and some shortness of breath that was more so than her baseline and she was started on IV steroids, DuoNeb and Levaquin empirically. X-ray of the chest is negative for pneumonia. She felt well and was eager to return home with home hospice and she was discharged.   CODE STATUS: She is DNR.  TOTAL TIME SPENT: 35 minutes.  ____________________________ Krystal EatonShayiq Danessa Mensch, MD sa:aw D: 07/15/2013 08:17:14 ET T: 07/15/2013 09:18:03 ET JOB#: [id number]  cc: Krystal EatonShayiq Lakyra Tippins, MD, <Dictator> Krystal EatonSHAYIQ Iman Reinertsen MD ELECTRONICALLY SIGNED 07/27/2013 14:53

## 2015-01-17 NOTE — H&P (Signed)
PATIENT NAME:  [name], [name] MR#:  [id number] DATE OF BIRTH:  1935-09-04  DATE OF ADMISSION:  07/05/2013  PRIMARY CARE PHYSICIAN: From [facility].   EMERGENCY ROOM PHYSICIAN:  Dr. Cyril Loosen.   CHIEF COMPLAINT: Shortness of breath.   HISTORY OF PRESENT ILLNESS: The patient is a 80 year old female with history of COPD on 3 liters of oxygen at home, came in because of shortness of breath, which is getting worse for the past 1 week. She also noticed some swelling, pedal edema, orthopnea, PND, so because of this, she does brought in here. She denies any chest pain. Has some cough and phlegm. No fever. Has no nausea, no vomiting, no dizziness. O2 saturations are 94%  here on 3 liters of oxygen. The patient more wheezing in the lungs.  We are asked to admit for COPD exacerbation.   PAST MEDICAL HISTORY: Significant for:  1.  History of hypertension.  2.  Heart failure, which is diastolic heart failure.  3.  History of COPD, continues to smoke.  4.  History of paroxysmal atrial fibrillation.  5.  Mild dementia.   ALLERGIES: AUGMENTIN, CLARITIN, DOXYCYCLINE, FENTANYL, FLAGYL, HCTZ, MIDAZOLAM, NEURONTIN, PREDNISONE, TYLENOL, ULTRACET, ZYRTEC. THE PATIENT IS ALLERGIC TO PREDNISONE, BUT ACCORDING TO HUSBAND SHE DOES TAKE 20 MG OF PREDNISONE EVERY DAY.   FAMILY HISTORY: The patient continues to smoke, about 1 pack every 2 days. She has a history of 50 pack-year smoking. Occasional alcohol. No drugs. Lives with husband.   FAMILY HISTORY: Mother and father died of complications of old age.  SURGICAL HISTORY:   No history of operations.  MEDICATIONS: Include Advair Diskus 250/50, 1 puff b.i.d., Atrovent nebulizer every 6 hours, amiodarone 200 mg 2 tablets once a day, amlodipine 5 mg p.o. daily, Coreg 25 mg p.o. b.i.d., Flexeril 10 mg every 8 hours as needed, Aricept 5 mg p.o. daily, hydralazine 25 mg 4 times daily, Lasix 80 mg p.o. b.i.d., mirtazapine 15 mg daily, lisinopril 10 mg daily, vitamin D3, 50,000 units  once a week. She is also on prednisone 20 mg daily for years, and that listed in not listed in the medications.   REVIEW OF SYSTEMS: CONSTITUTIONAL: Has no fever. No fatigue. No weakness.  EYES: No blurred vision. No inflammation.  ENT: No tinnitus. No epistaxis. No difficulty swallowing.  RESPIRATORY: Does have shortness of breath, getting worse progressive for a week, associated with some cough and wheezing, and the patient has a history of COPD and congestive heart failure.  CARDIOVASCULAR: No chest pain, but does have orthopnea and pedal edema. Edema is getting worse for the past 1 week.  GASTROINTESTINAL: No nausea. No vomiting. No abdominal pain, but  abdomen is bloated. GENITOURINARY:  No dysuria.  ENDOCRINE: No polyuria. No nocturia.  INTEGUMENTARY: No skin rashes.  MUSCULOSKELETAL: No joint pains.  NEUROLOGIC: No numbness or weakness. No dysarthria.  PSYCHIATRIC: No anxiety or insomnia.   PHYSICAL EXAMINATION: VITAL SIGNS: Temperature 98.3, heart rate 75, blood pressure 114/67, sats 94% 3 liters of oxygen. This is an elderly female having some shortness of breath, and answering questions appropriately.  HEENT: Head atraumatic, normocephalic. Pupils equally reacting to light. Extraocular movements intact. Nose: No turbinate hypertrophy. Ears: No tympanic membrane congestion. Mouth: No oropharyngeal erythema.  NECK: Supple. No JVD. No carotid bruit.  CARDIOVASCULAR: S1, S2 regular. No murmurs. PMI is not displaced. The patient does have peripheral edema up to knees bilaterally.  RESPIRATORY: Bilateral basilar crepitations present, and also some expiratory wheeze present. Not using accessory muscles  of respiration.   GASTROINTESTINAL: Abdomen is soft, nontender. Obese. Bowel sounds present in all quadrants. No hepatosplenomegaly.  MUSCULOSKELETAL: Gait not tested.  SKIN: Inspection is normal.  NEUROLOGIC: Alert, awake, oriented. Cranial nerves II through XII intact. Power 5/5 in upper  and lower extremities. Sensation is intact. DTRs are 2+ bilaterally.    PSYCHIATRIC: Mood and affect are within normal limits.   LABORATORY DATA:  WBC 7.6, hemoglobin is 9.4, hematocrit 28.5, platelets 222. ELECTROLYTES: Sodium 140, potassium 3.5, BUN 37, creatinine 2.10, chloride 100, bicarb 34, glucose 219. Troponin is less than 0.02. BNP is 956. LFTs within normal limits.   Chest x-ray is clear.  EKG shows normal sinus with no ST-T changes.   ASSESSMENT AND PLAN:  The patient is a 80 year old female patient with chronic obstructive pulmonary disease and congestive heart failure exacerbation, acute on chronic respiratory failure. Due to COPD and CHF exacerbation, the patient is going to be admitted to telemetry.  Continue IV Solu-Medrol, along with Levaquin, along with nebulizers and continue oxygen. The patient's x-ray looks clear. 2.   Congestive heart failure exacerbation.  She was on Lasix 80 q.12 hours, but she still has pedal edema. We are going to continue lasix monitor daily weights, in and outs. The patient had an echocardiogram this year in April and EF is at 70%, so it is acute on chronic diastolic heart failure. Continue to watch closely.  3.  Acute renal failure with normal kidney function before. She is on IV Lasix. We have to carefully watch the kidney function and see.  4.  History of paroxysmal fibrillation. Rate controlled on Coreg and amiodarone. Continue that.  5.  History of mild dementia. She is on Aricept. Continue that.  6.  History of depression. Continue  mirtazapine.  Discussed the plan with the patient. Regarding tobacco use, she still smokes. Counseled again on smoking cessation for 5 minutes, offered assistance.   TIME SPENT: About 60 minutes on history and physical.    ____________________________ Katha HammingSnehalatha Saleen Peden, MD sk:dmm D: 07/05/2013 18:29:10 ET T: 07/05/2013 19:14:07 ET JOB#: [id number]  cc: Katha HammingSnehalatha Channie Bostick, MD, <Dictator> Katha HammingSNEHALATHA Liev [place]  MD ELECTRONICALLY SIGNED 08/09/2013 22:24 Katha HammingSNEHALATHA [place] MD ELECTRONICALLY SIGNED 08/09/2013 22:34

## 2015-01-17 NOTE — Discharge Summary (Signed)
PATIENT NAME:  [name], [name] MR#:  [id number] DATE OF BIRTH:  1934-12-07  DATE OF ADMISSION:  01/08/2013 DATE OF DISCHARGE:  01/13/2013  ADMISSION DIAGNOSES:  Acute on chronic respiratory failure due to chronic obstructive pulmonary disease exacerbation.   DISCHARGE DIAGNOSES: 1.  Acute on chronic respiratory failure secondary to acute chronic obstructive pulmonary disease exacerbation. 2.  Acute chronic obstructive pulmonary disease exacerbation. 3.  Hyponatremia. 4.  Hypertension. 5.  History of paroxysmal atrial fibrillation. 6.  Dementia.  CONSULTS:  Dr. Cherylann RatelLateef  LABORATORY DATA: Sodium 128, potassium 4.1, chloride 90, bicarb 32, BUN 17, creatinine 0.86, glucose 146.   CT of the chest shows irregular margin noncalcified soft tissue nodular density anteriorly in the left upper lobe. Short-term followup is recommended in 3 months.   HOSPITAL COURSE:  A 80 year old female with COPD who presented with acute on chronic respiratory failure.  For further details, please refer to the H and P.   1.  Acute on chronic respiratory failure secondary to acute chronic obstructive pulmonary disease exacerbation as outlined below.  2.  Acute chronic obstructive pulmonary disease exacerbation. The patient was placed on IV steroids, oxygen, her inhalers, as well as antibiotics and nebulizer treatments for acute chronic obstructive pulmonary disease exacerbation. She actually did quite well with this regimen. Her steroids were transitioned to oral prednisone, although she has an ALLERGY TO PREDNISONE, her husband said that it was not a real allergy so we did start prednisone here. She had no effects whatsoever and did quite well. Her lungs are without wheezing. She is on her baseline oxygen of 1 to 2 liters which she will continue at home. She will need outpatient PFTs to get a baseline for chronic obstructive pulmonary disease. She also has an abnormal CT which shows a left upper lobe irregular nodule or  soft density. She will need a repeat CT scan in about 3 months. She does not have a pulmonologist, so we did provide her with a phone number to call Dr. Meredeth IdeFleming and I spoke with her and her husband regarding the CT, as well as follow up with Dr. Meredeth IdeFleming. They will need to call on Monday to make an appointment. They were in clear understanding of this plan.  3.  Hypertension. Blood pressure is stable   4. history of paroxysmal atrial fibrillation, the patient she wil continue amiodarone and Coreg, not on any anticoagulation. Her rate was controlled secondary to history of bleeding.  5.  Hyponatremia initially thought to be from hypovolemia but looks more like a SIADH picture per nephrology. She is on 1000 mL fluid restriction. Her sodium is 128 and she will need to be followed up on Tuesday with Dr. Wynelle LinkKolluru, which was also discussed with her.   DISCHARGE MEDICATIONS: 1.  Omega-3 fatty acids 1 tablet daily.  2.  Combivent 2 puffs q. 6 hours p.r.n.  3.  Aspirin 81 mg daily.  4.  Hydralazine 25 four times a day.  5.  Advair Diskus 250/50 b.i.d.  6.  Donepezil 5 mg daily.  7.  Amiodarone 200 mg 2 tablets daily.  8.  Norvasc 5 mg daily.  9.  Benzoate 200 mg b.i.d.  10.  Coreg 25 b.i.d.  11.  Flexeril 10 mg b.i.d.  12.  Spiriva 18 mcg daily.  13.  Albuterol ipratropium q. 8 hours p.r.n.  14.  Prednisone taper starting at 60 mg, taper x 10 mg every 3 days.  15.  Azithromycin 500 mg daily for 3 days.  16.  Guaifenesin 5 mL q. 6 hours p.r.n.  DISCHARGE OXYGEN:  2 liters nasal cannula.   DISCHARGE ACTIVITY:  As tolerated.   DISCHARGE DIET:  Regular.   DISCHARGE FOLLOWUP:  The patient will follow up early next week for Dr. Wynelle Link; also, she was provided Dr. Reita Cliche office to contact for new patient visit, which she will need to follow up in about 2 weeks.   The patient is medically stable for discharge.  TIME SPENT:  Approximately 35 minutes     ____________________________ Marcena Dias P.  Juliene Pina, MD spm:ce D: 01/13/2013 12:27:39 ET T: 01/13/2013 14:33:35 ET JOB#: [id number]  cc: Henreitta Spittler P. Juliene Pina, MD, <Dictator>  Dr. Meredeth Ide Dr. Ellan Lambert [facility] Arnetra Terris P Keon Pender MD ELECTRONICALLY SIGNED 01/14/2013 12:43

## 2015-01-17 NOTE — H&P (Signed)
PATIENT NAME:  [name], [name] MR#:  [id number] DATE OF BIRTH:  [birth year]  DATE OF ADMISSION:  01/08/2013  PRIMARY CARE PROVIDER: Dr. Mariana Kaufman at [facility].   ED REFERRING PHYSICIAN: Dr. Mayford Knife.   CHIEF COMPLAINT: Shortness of breath, wheezing, abdominal distention.   HISTORY OF PRESENT ILLNESS: The patient is a pleasant 80 year old white female who has a history of chronic obstructive pulmonary disease, history of diastolic congestive heart failure in the past, who was recently hospitalized on March 30  and then discharged a few days later. At that time, she was admitted with chronic obstructive pulmonary disease exacerbation. The patient reports that when she went home. She was feeling much better and then subsequently started having shortness of breath, cough and congestion. She also started having a lot of wheezing. She also noticed that her abdomen was distended. She reports that similar type of thing happened in November when she had her congestive heart failure, exacerbation. She has not had any fevers or chills. Denies any chest pain, palpitations. Denies any lower extremity swelling. Denies any calf pain or swelling. Denies any nausea, vomiting or diarrhea.   PAST MEDICAL HISTORY: Significant for:  1.  Chronic obstructive pulmonary disease.  2.  History of paroxysmal atrial fibrillation.  3.  Hypertension.  4.  History of diastolic congestive heart failure in the past. Last echo was in 2011.  5.  Dementia.   ALLERGIES: AUGMENTIN, CLARITIN, DOXYCYCLINE, FENTANYL, FLAGYL, HCTZ, MIDAZOLAM, NEURONTIN, PREDNISONE, TYLENOL, ULTRACET, ZYRTEC.   CURRENT MEDICATIONS: Advair 250/50 one puff b.i.d., albuterol, Atrovent nebulizers q. 8 hours p.r.n. wheezing, amiodarone 400 daily, amlodipine 5 daily, aspirin 81 mg 1 tab p.o. daily, benzonatate 200, 1 tab p.o. b.i.d., carvedilol 25 mg 1 tab p.o. b.i.d., Combivent 2 puffs q. 6 p.r.n., cyclobenzaprine 10 one tab p.o. b.i.d., donepezil 5 mg daily, hydralazine  25 four times per day, hydrochlorothiazide 50 mg 1 tab p.o. b.i.d., omega-3 fatty acids 1000 mg 1 tab p.o. daily, Spiriva hand-held inhaler daily.   SOCIAL HISTORY: The patient states that she use to smoke up until the last hospitalization, has not smoked. Has a history of about 40 to 50 pack-years of smoking. No alcohol abuse. No illicit drug use.   FAMILY HISTORY: Both mother and father died from complication of old age.   REVIEW OF SYSTEMS:   CONSTITUTIONAL: Denies any fevers, chills. Complains of some weight gain.  EYES: Denies any blurred vision. No erythema. No cataracts. No glaucoma.  EARS, NOSE, MOUTH AND THROAT: Denies any nasal drainage. No epistaxis. No difficulty swallowing.  CARDIOVASCULAR: Denies any chest pains or palpitations. Does have history of hypertension. Denies any syncope.  PULMONARY: Complains of cough, wheezing, shortness of breath. No hemoptysis.  GASTROINTESTINAL: Denies any nausea or vomiting. Complains of left lower quadrant abdominal pain. Reports that bowel movements are normal. Denies any diarrhea.  GENITOURINARY: Denies any frequency, urgency or hesitancy.  MUSCULOSKELETAL: Denies any joint pains or legs swelling.  NEUROLOGICAL: No CVA. No transient ischemic attack. No seizures.  PSYCHIATRIC: Denies any anxiety or depression.  ENDOCRINE: Denies any polydipsia, polyphagia.  HEMATOLOGIC AND LYMPHATICS: Denies any anemia or easy bruisability.   PHYSICAL EXAMINATION: VITAL SIGNS: Temperature 98, pulse 69, respirations 20, blood pressure 132/59,  O2 100%.  GENERAL: The patient is an elderly white female in mild respiratory distress.  HEENT: Head atraumatic, normocephalic. Pupils equally round, reactive to light and accommodation. There is no conjunctival pallor. No scleral icterus. Nasal exam shows no drainage or ulceration. Oropharynx is clear without any  exudates.  NECK: No thyromegaly. No carotid bruits.  CARDIOVASCULAR: Regular rate and rhythm. No murmurs,  rubs, clicks or gallops. PMI is not displaced.  LUNGS: She has diminished breath sounds bilaterally without any rales, rhonchi or wheezing.  ABDOMEN: Mildly distended, but there is no guarding. No rebound. Positive bowel sounds x 4. There is no hepatosplenomegaly.   EXTREMITIES: No clubbing, cyanosis or edema.  SKIN: No rash.  LYMPHATICS: No lymph nodes palpable.  VASCULAR: Good DP, PT pulses.  PSYCHIATRIC: Not anxious or depressed.  NEUROLOGICAL: Awake, alert, oriented x 3. No focal deficits.   LABORATORY, DIAGNOSTIC AND RADIOLOGIC DATA: BMP 139, glucose 150, BUN 13, creatinine 0.90, sodium 128, potassium was 5.3, chloride 89, CO2 was 33, calcium 8.4.   LFTs: Total protein 6.4, albumin 3.1, bilirubin total 0.2, alkaline phosphatase 64, AST 34, ALT 689. WBC count 8.6, hemoglobin 10.1, platelet count 209.   Urinalysis: Nitrites negative, leukocytes negative pH of 7.35, pCO2 of 63.   Chest x-ray, PA and lateral shows no acute cardiopulmonary processes.   ASSESSMENT AND PLAN: The patient is a 80 year old white female with a history of chronic obstructive pulmonary disease, previous history of diastolic congestive heart failure, pleural effusion, who presents with shortness of breath, Abdominal distention and swelling.  1.  Acute on chronic respiratory failure on chronic oxygen, at home, likely cause for acute respiratory failure is acute chronic obstructive pulmonary disease exacerbation and possible diastolic congestive heart failure as well as right-sided heart failure, although her chest x-ray is negative. At this time we will check a BNP. I will place her on IV Lasix. IV Solu-Medrol and we will do nebulizers every 6 and treat her with IV azithromycin.  2.  Hypertension. Blood pressure is currently in the 120s. I will hold her hydralazine, HCTZ. She will be on IV Lasix. Her blood pressure will likely drop further.  3.  History of paroxysmal atrial fibrillation. Continue amiodarone.  4.   Hyponatremia possibly due to volume overload. We will give her IV Lasix, follow with sodium.  5.  Dementia. Continue Aricept.  6.  CODE STATUS: Full.  7.  Miscellaneous. We will place her on Lovenox for deep vein thrombosis prophylaxis.    TIME SPENT: 45 minutes spent on H and P.     ____________________________ Lacie ScottsShreyang H. Allena KatzPatel, MD shp:cc D: 01/08/2013 16:05:26 ET T: 01/08/2013 17:02:24 ET JOB#: [id number]  cc: Vikram Tillett H. Allena KatzPatel, MD, <Dictator> Charise CarwinSHREYANG H Christena Sunderlin MD ELECTRONICALLY SIGNED 01/09/2013 20:38
